# Patient Record
Sex: Male | Born: 1965 | Race: White | Hispanic: No | Marital: Single | Smoking: Current some day smoker
Health system: Southern US, Community
[De-identification: ages and names within clinical notes are randomized; demographics above are authoritative.]

## PROBLEM LIST (undated history)

## (undated) DIAGNOSIS — F419 Anxiety disorder, unspecified: Secondary | ICD-10-CM

## (undated) DIAGNOSIS — I10 Essential (primary) hypertension: Secondary | ICD-10-CM

## (undated) HISTORY — PX: ANKLE SURGERY: SHX546

## (undated) HISTORY — PX: NECK SURGERY: SHX720

## (undated) HISTORY — DX: Anxiety disorder, unspecified: F41.9

## (undated) HISTORY — PX: BACK SURGERY: SHX140

---

## 2016-11-15 ENCOUNTER — Emergency Department
Admission: EM | Admit: 2016-11-15 | Discharge: 2016-11-15 | Disposition: A | Discharge status: Home / Self Care | Payer: Self-pay | Source: Home / Self Care | Attending: Family Medicine | Admitting: Family Medicine

## 2016-11-15 ENCOUNTER — Emergency Department (INDEPENDENT_AMBULATORY_CARE_PROVIDER_SITE_OTHER): Payer: Self-pay

## 2016-11-15 ENCOUNTER — Encounter: Payer: Self-pay | Admitting: *Deleted

## 2016-11-15 DIAGNOSIS — H6502 Acute serous otitis media, left ear: Secondary | ICD-10-CM

## 2016-11-15 DIAGNOSIS — R0981 Nasal congestion: Secondary | ICD-10-CM

## 2016-11-15 DIAGNOSIS — J0100 Acute maxillary sinusitis, unspecified: Secondary | ICD-10-CM

## 2016-11-15 DIAGNOSIS — H9203 Otalgia, bilateral: Secondary | ICD-10-CM

## 2016-11-15 HISTORY — DX: Essential (primary) hypertension: I10

## 2016-11-15 MED ORDER — AMOXICILLIN 875 MG PO TABS: 875.0000 mg | ORAL_TABLET | Freq: Two times a day (BID) | ORAL | 0 refills | Status: DC

## 2016-11-15 MED ORDER — PREDNISONE 20 MG PO TABS: ORAL_TABLET | ORAL | 0 refills | Status: DC

## 2016-11-15 NOTE — ED Provider Notes (Signed)
[facility]    CSN: [id number] Arrival date & time: 11/15/16  1543     History   Chief Complaint Chief Complaint  Patient presents with  . Nasal Congestion    HPI Julian Myers is a 50 y.o. male.   Patient reports that he developed increased sinus congestion about a month ago, with intermittent sore throat and post-nasal drainage.  About 3 weeks ago his ears began to feel clogged and he developed facial pressure and pain.  During the past two weeks he has had increased cough with occasional shortness of breath and wheezing.  His symptoms have not responded to Advil Sinus and Alka Seltzer.   The history is provided by the patient.    Past Medical History:  Diagnosis Date  . Hypertension     There are no active problems to display for this patient.   Past Surgical History:  Procedure Laterality Date  . ANKLE SURGERY    . BACK SURGERY    . NECK SURGERY         Home Medications    Prior to Admission medications   Medication Sig Start Date End Date Taking? Authorizing Provider  Pseudoephedrine-Ibuprofen (ADVIL COLD/SINUS PO) Take by mouth.   Yes Historical Provider, MD  amoxicillin (AMOXIL) 875 MG tablet Take 1 tablet (875 mg total) by mouth 2 (two) times daily. 11/15/16   Lattie Haw, MD  predniSONE (DELTASONE) 20 MG tablet Take one tab by mouth twice daily for 5 days, then one daily. Take with food. 11/15/16   Lattie Haw, MD    Family History Family History  Problem Relation Age of Onset  . Diabetes Mother   . Hypertension Mother     Social History Social History  Substance Use Topics  . Smoking status: Never Smoker  . Smokeless tobacco: Never Used  . Alcohol use Yes     Comment: 5 q wk     Allergies   Patient has no known allergies.   Review of Systems Review of Systems + sore throat + cough No pleuritic pain + wheezing + nasal congestion + post-nasal drainage + sinus pain/pressure No itchy/red eyes ? earache No  hemoptysis + SOB No fever/chills No nausea No vomiting No abdominal pain No diarrhea No urinary symptoms No skin rash + fatigue No myalgias No headache Used OTC meds without relief   Physical Exam Triage Vital Signs ED Triage Vitals  Enc Vitals Group     BP 11/15/16 1608 (!) 156/119     Pulse Rate 11/15/16 1608 94     Resp 11/15/16 1608 16     Temp 11/15/16 1608 97.8 F (36.6 C)     Temp Source 11/15/16 1608 Oral     SpO2 11/15/16 1608 96 %     Weight 11/15/16 1608 222 lb (100.7 kg)     Height --      Head Circumference --      Peak Flow --      Pain Score 11/15/16 1609 0     Pain Loc --      Pain Edu? --      Excl. in GC? --    No data found.   Updated Vital Signs BP (!) 156/119 (BP Location: Left Arm)   Pulse 94   Temp 97.8 F (36.6 C) (Oral)   Resp 16   Wt 222 lb (100.7 kg)   SpO2 96%   Visual Acuity Right Eye Distance:   Left Eye Distance:  Bilateral Distance:    Right Eye Near:   Left Eye Near:    Bilateral Near:     Physical Exam Nursing notes and Vital Signs reviewed. Appearance:  Patient appears stated age, and in no acute distress Eyes:  Pupils are equal, round, and reactive to light and accomodation.  Extraocular movement is intact.  Conjunctivae are not inflamed  Ears:  Canals normal.  Right tympanic membrane normal; left tympanic membrane has serous effusion. Nose:  Mildly congested turbinates.  Maxillary sinus tenderness is present.  Pharynx:   Uvula erythematous. Neck:  Supple.  Tender enlarged posterior/lateral nodes are palpated bilaterally  Lungs:  Clear to auscultation.  Breath sounds are equal.  Moving air well. Heart:  Regular rate and rhythm without murmurs, rubs, or gallops.  Abdomen:  Nontender without masses or hepatosplenomegaly.  Bowel sounds are present.  No CVA or flank tenderness.  Extremities:  No edema.  Skin:  No rash present.    UC Treatments / Results  Labs (all labs ordered are listed, but only abnormal results  are displayed)  Labs Reviewed -   Tympanometry:  Right ear tympanogram positive peak pressure; Left ear tympanogram low peak height   EKG  EKG Interpretation None       Radiology Study Result   CLINICAL DATA:  Bilateral ear pain, nasal congestion and sinus pressure for 1 month.  EXAM: PARANASAL SINUSES - COMPLETE 3 + VIEW  COMPARISON:  None.  FINDINGS: The paranasal sinus are aerated. There is no evidence of sinus opacification air-fluid levels or mucosal thickening. No significant bone abnormalities are seen.  IMPRESSION: Negative exam.   Electronically Signed   By: Drusilla Kannerhomas  Dalessio M.D.   On: 11/15/2016 16:39     Procedures Procedures (including critical care time)  Medications Ordered in UC Medications - No data to display   Initial Impression / Assessment and Plan / UC Course  I have reviewed the triage vital signs and the nursing notes.  Pertinent labs & imaging results that were available during my care of the patient were reviewed by me and considered in my medical decision making (see chart for details).  Clinical Course   Patient's congestion may be primarily allergy related since sinus films negative. Suspect concomitant viral URI contributing cough and increased sinus congestion. Will begin prednisone burst/taper, and amoxicillin. May add Pseudoephedrine (30mg , one every 4 to 6 hours) for sinus congestion.  Get adequate rest.   May use Afrin nasal spray (or generic oxymetazoline) twice daily for about 5 days and then discontinue.  Also recommend using saline nasal spray several times daily and saline nasal irrigation (AYR is a common brand).  Use Flonase nasal spray each morning after using Afrin nasal spray and saline nasal irrigation. Stop all antihistamines for now, and other non-prescription cough/cold preparations. After prednisone finished, try an antihistamine such as Zyrtec daily.  Advised to monitor blood pressure and have blood  pressure evaluated if it remains elevated.     Final Clinical Impressions(s) / UC Diagnoses   Final diagnoses:  Acute maxillary sinusitis, recurrence not specified  Acute serous otitis media of left ear, recurrence not specified    New Prescriptions Discharge Medication List as of 11/15/2016  4:47 PM    START taking these medications   Details  amoxicillin (AMOXIL) 875 MG tablet Take 1 tablet (875 mg total) by mouth 2 (two) times daily., Starting Thu 11/15/2016, Print    predniSONE (DELTASONE) 20 MG tablet Take one tab by mouth twice daily  for 5 days, then one daily. Take with food., Print         Lattie HawStephen A Beese, MD 11/23/16 [phone]

## 2016-11-15 NOTE — Discharge Instructions (Signed)
May add Pseudoephedrine (30mg , one every 4 to 6 hours) for sinus congestion.  Get adequate rest.   May use Afrin nasal spray (or generic oxymetazoline) twice daily for about 5 days and then discontinue.  Also recommend using saline nasal spray several times daily and saline nasal irrigation (AYR is a common brand).  Use Flonase nasal spray each morning after using Afrin nasal spray and saline nasal irrigation. Stop all antihistamines for now, and other non-prescription cough/cold preparations.   Have blood pressure evaluated if it remains elevated.

## 2016-11-15 NOTE — ED Triage Notes (Signed)
Pt c/o nasal congestion, sinus pressure, bilateral ear pain and cough x 1 mth. Denies fever. He has taken Mucinex and Advil cold and sinus without relief.

## 2016-11-24 ENCOUNTER — Encounter: Payer: Self-pay | Admitting: Emergency Medicine

## 2016-11-24 ENCOUNTER — Emergency Department
Admission: EM | Admit: 2016-11-24 | Discharge: 2016-11-24 | Disposition: A | Discharge status: Home / Self Care | Payer: Self-pay | Source: Home / Self Care | Attending: Family Medicine | Admitting: Family Medicine

## 2016-11-24 DIAGNOSIS — H6983 Other specified disorders of Eustachian tube, bilateral: Secondary | ICD-10-CM

## 2016-11-24 MED ORDER — HYDROCODONE-ACETAMINOPHEN 5-325 MG PO TABS: 1.0000 | ORAL_TABLET | Freq: Four times a day (QID) | ORAL | 0 refills | Status: DC | PRN

## 2016-11-24 MED ORDER — METHYLPREDNISOLONE ACETATE 80 MG/ML IJ SUSP
80.0000 mg | Freq: Once | INTRAMUSCULAR | Status: AC
  Administered 2016-11-24: 80 mg via INTRAMUSCULAR

## 2016-11-24 MED ORDER — CEFDINIR 300 MG PO CAPS: 300.0000 mg | ORAL_CAPSULE | Freq: Two times a day (BID) | ORAL | 0 refills | Status: DC

## 2016-11-24 NOTE — ED Triage Notes (Signed)
Pt c/o bilateral ear pain has worsen since completing the prednisone and augmentin, worse @ night.  Seen here on 11/15/2016.

## 2016-11-24 NOTE — ED Provider Notes (Signed)
[facility]    CSN: [id number] Arrival date & time: 11/24/16  1019     History   Chief Complaint Chief Complaint  Patient presents with  . Otalgia    HPI Julian Myers is a 50 y.o. male.   Patient was treated here 11/18/16 for sinusitis and serous otitis.  He has finished amoxicillin and prednisone, but still has nasal congestion and bilateral ear pain worse at night.  No cough, fever, or other symptoms.   The history is provided by the patient.    Past Medical History:  Diagnosis Date  . Hypertension     There are no active problems to display for this patient.   Past Surgical History:  Procedure Laterality Date  . ANKLE SURGERY    . BACK SURGERY    . NECK SURGERY         Home Medications    Prior to Admission medications   Medication Sig Start Date End Date Taking? Authorizing Provider  cefdinir (OMNICEF) 300 MG capsule Take 1 capsule (300 mg total) by mouth 2 (two) times daily. 11/24/16   Lattie HawStephen A Kelbi Renstrom, MD    Family History Family History  Problem Relation Age of Onset  . Diabetes Mother   . Hypertension Mother     Social History Social History  Substance Use Topics  . Smoking status: Never Smoker  . Smokeless tobacco: Never Used  . Alcohol use Yes     Comment: 5 q wk     Allergies   Patient has no known allergies.   Review of Systems Review of Systems No sore throat No cough No pleuritic pain No wheezing + nasal congestion No post-nasal drainage No sinus pain/pressure No itchy/red eyes + earache No hemoptysis No SOB No fever/chills No nausea No vomiting No abdominal pain No diarrhea No urinary symptoms No skin rash No fatigue No myalgias No headache Used OTC meds without relief   Physical Exam Triage Vital Signs ED Triage Vitals  Enc Vitals Group     BP 11/24/16 1047 141/79     Pulse Rate 11/24/16 1047 98     Resp --      Temp 11/24/16 1047 98.2 F (36.8 C)     Temp Source 11/24/16 1047 Oral   SpO2 11/24/16 1047 95 %     Weight 11/24/16 1048 224 lb (101.6 kg)     Height 11/24/16 1048 6\' 1"  (1.854 m)     Head Circumference --      Peak Flow --      Pain Score 11/24/16 1049 7     Pain Loc --      Pain Edu? --      Excl. in GC? --    No data found.   Updated Vital Signs BP 141/79 (BP Location: Left Arm)   Pulse 98   Temp 98.2 F (36.8 C) (Oral)   Ht 6\' 1"  (1.854 m)   Wt 224 lb (101.6 kg)   SpO2 95%   BMI 29.55 kg/m   Visual Acuity Right Eye Distance:   Left Eye Distance:   Bilateral Distance:    Right Eye Near:   Left Eye Near:    Bilateral Near:     Physical Exam Nursing notes and Vital Signs reviewed. Appearance:  Patient appears stated age, and in no acute distress Eyes:  Pupils are equal, round, and reactive to light and accomodation.  Extraocular movement is intact.  Conjunctivae are not inflamed  Ears:  Canals normal.  Tympanic membranes normal.  Nose:  Mildly congested turbinates.  No sinus tenderness.   Pharynx:  Normal Neck:  Supple.  No adenopathy. Lungs:  Clear to auscultation.  Breath sounds are equal.  Moving air well. Heart:  Regular rate and rhythm without murmurs, rubs, or gallops.  Abdomen:  Nontender  Extremities:  No edema.  Skin:  No rash present.    UC Treatments / Results  Labs (all labs ordered are listed, but only abnormal results are displayed)  Labs Reviewed -   Tympanometry:  Right ear tympanogram low peak height; Left ear tympanogram low peak height ("flat line")  Review of previous tympanogram done 11/15/16 showed left tympanic membrane movement but decreased ("low peak height"), while right ear showed normal tympanic membrane movement but positive peak pressure.  Today's tympanogram shows complete lack of movement of left tympanic membrane ("flat line"), and significant decreased movement of right tympanic membrane. EKG  EKG Interpretation None       Radiology No results found.  Procedures Procedures (including  critical care time)  Medications Ordered in UC Medications  methylPREDNISolone acetate (DEPO-MEDROL) injection 80 mg (80 mg Intramuscular Given 11/24/16 1134)     Initial Impression / Assessment and Plan / UC Course  I have reviewed the triage vital signs and the nursing notes.  Pertinent labs & imaging results that were available during my care of the patient were reviewed by me and considered in my medical decision making (see chart for details).  Clinical Course   Administered Depo Medrol 80mg  IM  Begin Omnicef 300mg  BID Rx Lortab Q6hr prn pain Take Pseudoephedrine (30mg , one or two every 4 to 6 hours) for sinus congestion.  May use Afrin nasal spray (or generic oxymetazoline) twice daily for about 5 days and then discontinue.  Also recommend using saline nasal spray several times daily and saline nasal irrigation (AYR is a common brand).  Use Flonase nasal spray each morning after using Afrin nasal spray and saline nasal irrigation. Followup with ENT as soon as possible for further evaluation (advised to take copies of tympanograms to ENT).     Final Clinical Impressions(s) / UC Diagnoses   Final diagnoses:  Eustachian tube dysfunction, bilateral    New Prescriptions New Prescriptions   CEFDINIR (OMNICEF) 300 MG CAPSULE    Take 1 capsule (300 mg total) by mouth 2 (two) times daily.     Lattie HawStephen A Faven Watterson, MD 12/10/16 1045

## 2016-11-24 NOTE — Discharge Instructions (Signed)
Take Pseudoephedrine (30mg , one or two every 4 to 6 hours) for sinus congestion.  May use Afrin nasal spray (or generic oxymetazoline) twice daily for about 5 days and then discontinue.  Also recommend using saline nasal spray several times daily and saline nasal irrigation (AYR is a common brand).  Use Flonase nasal spray each morning after using Afrin nasal spray and saline nasal irrigation.

## 2016-11-29 ENCOUNTER — Telehealth: Payer: Self-pay | Admitting: *Deleted

## 2016-11-29 NOTE — Telephone Encounter (Signed)
Encounter created to enter tympanogram Order and result not entered on DOS.   

## 2018-08-06 IMAGING — DX DG SINUSES COMPLETE 3+V
3 series · 3 of 3 positions shown · non-contrast
Comparison: None.

CLINICAL DATA: Bilateral ear pain, nasal congestion and sinus
pressure for 1 month.

EXAM:
PARANASAL SINUSES - COMPLETE 3 + VIEW

[pns waters]
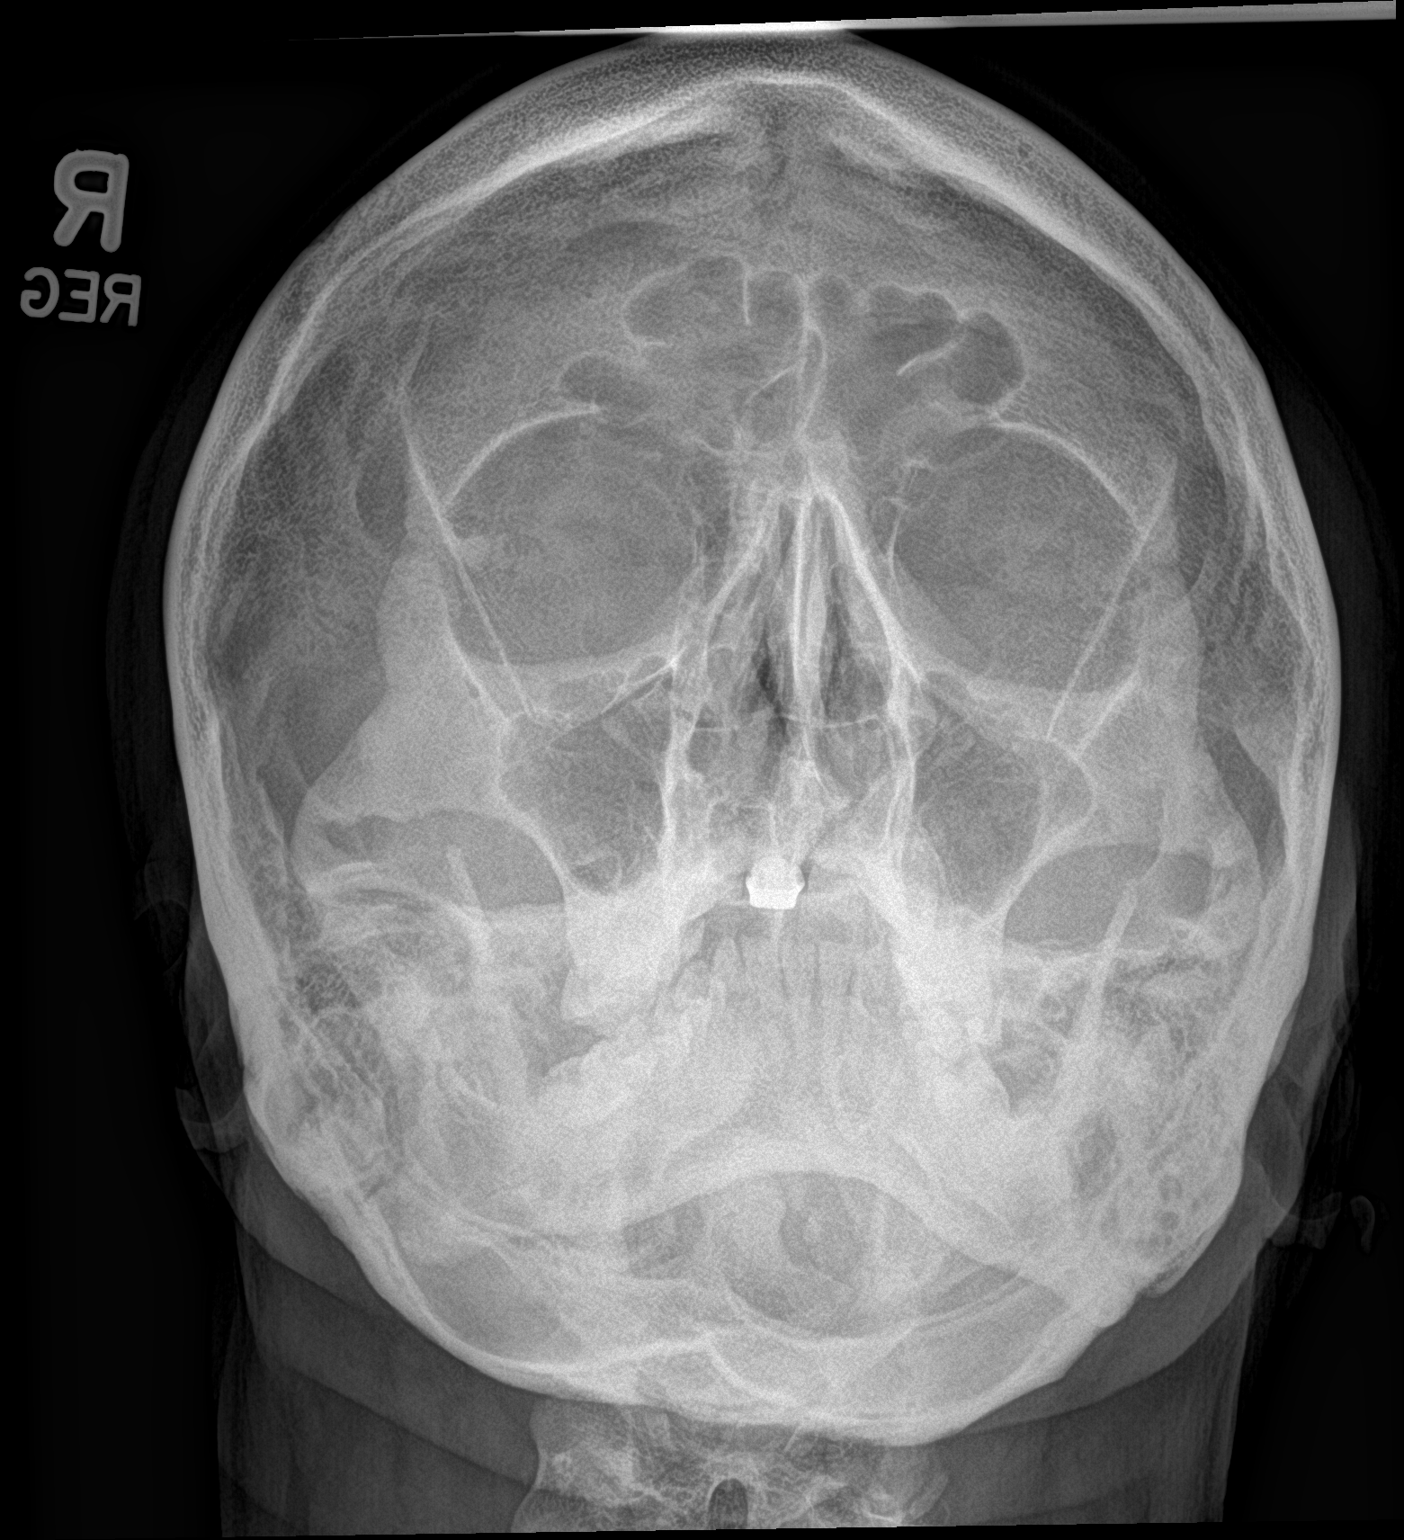

[[person_name]]
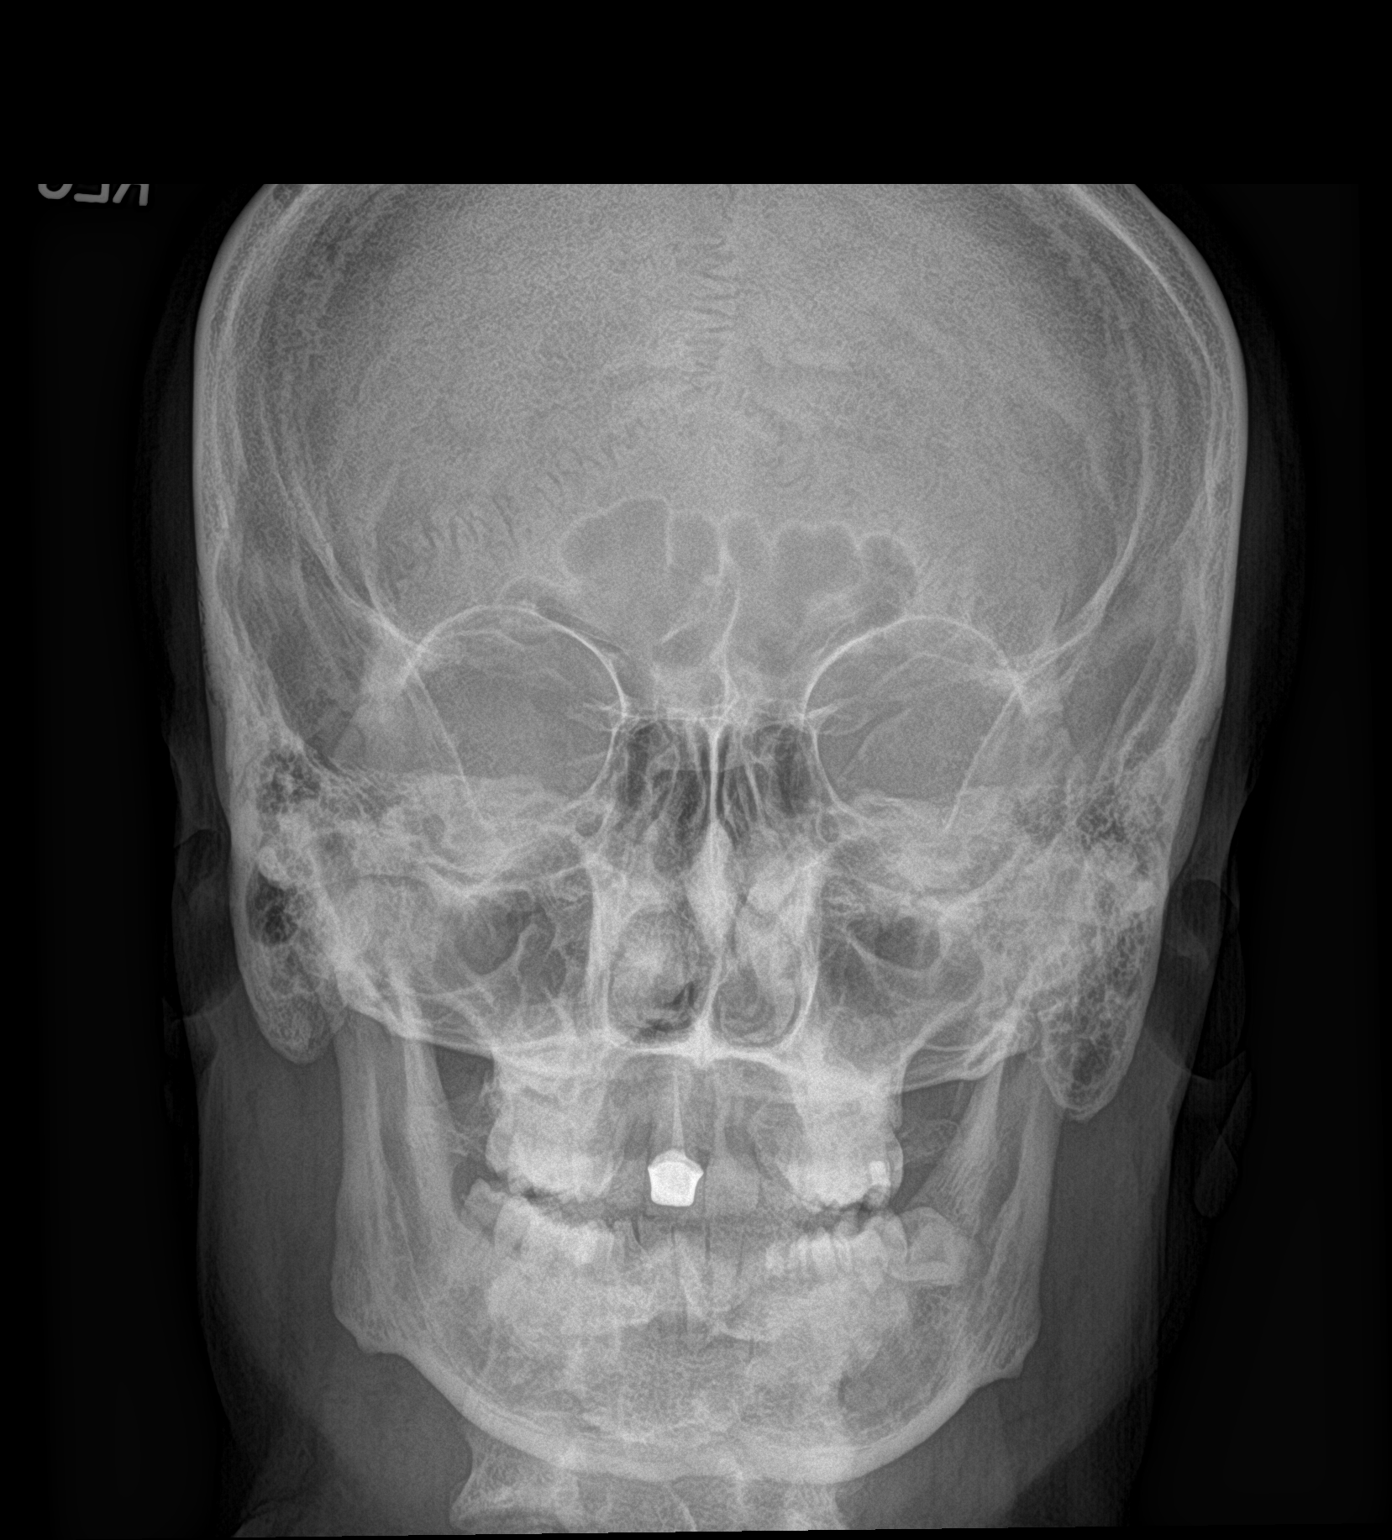

[pns lat]
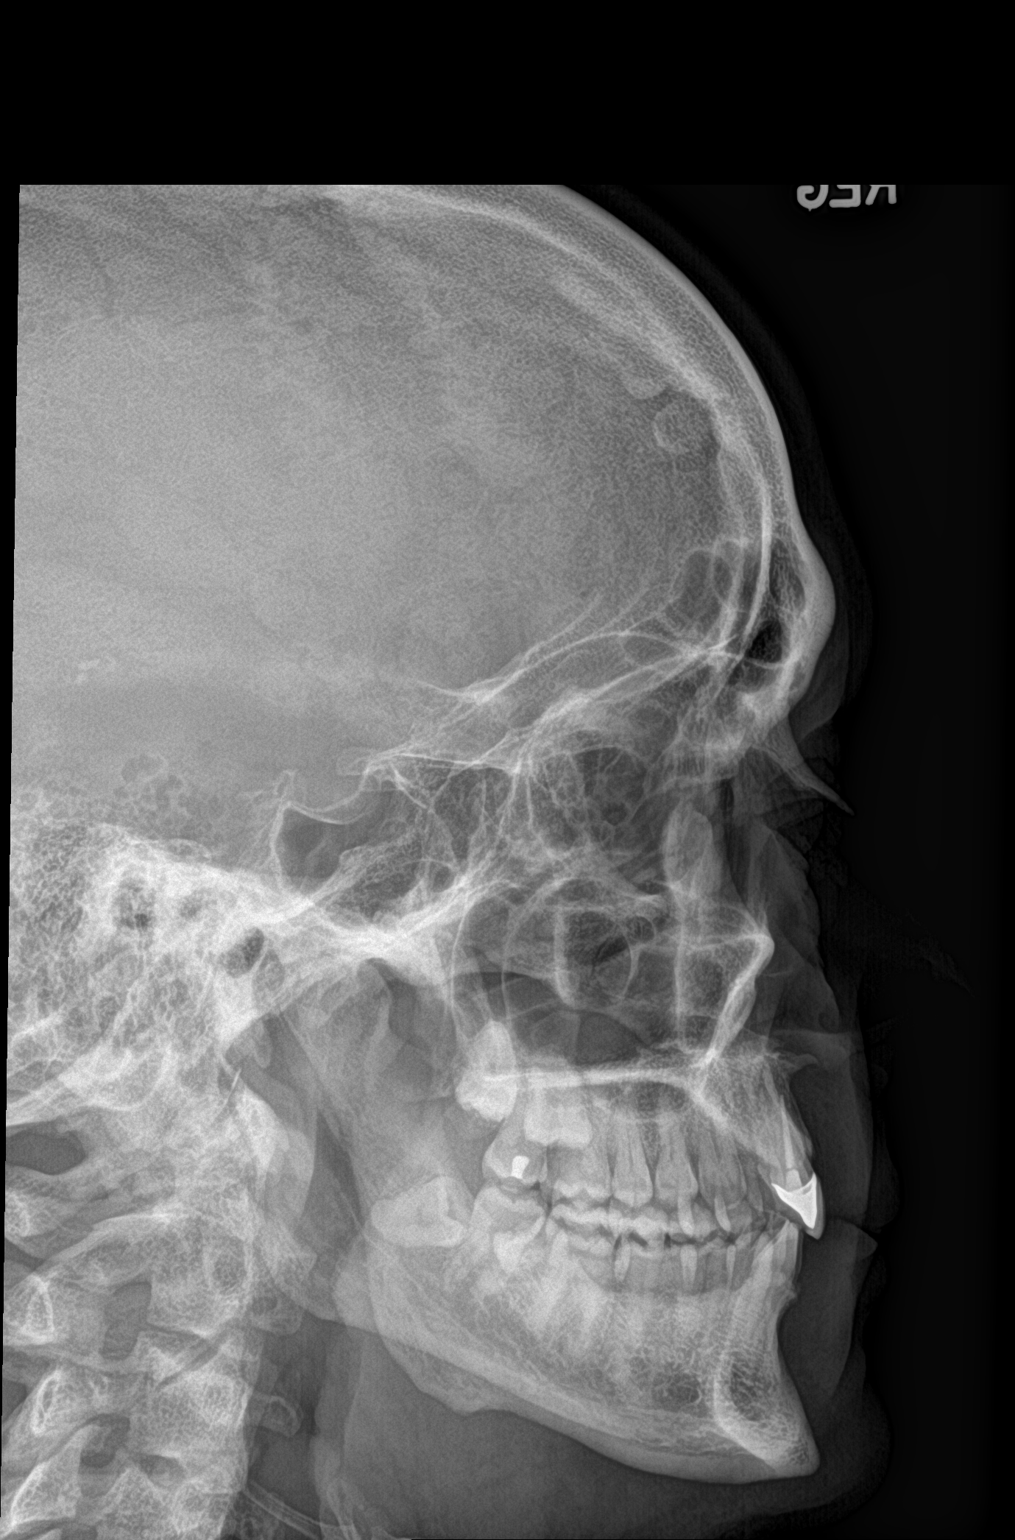

[3 of 3 positions shown; findings below may reference images not displayed]

FINDINGS: The paranasal sinus are aerated. There is no evidence of sinus
opacification air-fluid levels or mucosal thickening. No significant
bone abnormalities are seen.
IMPRESSION: Negative exam.

## 2018-08-15 ENCOUNTER — Other Ambulatory Visit: Payer: Self-pay

## 2018-08-15 ENCOUNTER — Emergency Department
Admission: EM | Admit: 2018-08-15 | Discharge: 2018-08-15 | Disposition: A | Discharge status: Home / Self Care | Payer: Self-pay | Source: Home / Self Care

## 2018-08-15 ENCOUNTER — Encounter: Payer: Self-pay | Admitting: Emergency Medicine

## 2018-08-15 DIAGNOSIS — J0100 Acute maxillary sinusitis, unspecified: Secondary | ICD-10-CM

## 2018-08-15 MED ORDER — AMOXICILLIN-POT CLAVULANATE 875-125 MG PO TABS: 1.0000 | ORAL_TABLET | Freq: Two times a day (BID) | ORAL | 0 refills | Status: DC

## 2018-08-15 NOTE — Discharge Instructions (Signed)
Return if any problems.

## 2018-08-15 NOTE — ED Provider Notes (Signed)
[facility]    CSN: [id number] Arrival date & time: 08/15/18  0816     History   Chief Complaint Chief Complaint  Patient presents with  . Nasal Congestion  . Headache  . Otalgia    HPI Julian Myers is a 52 y.o. male.   The history is provided by the patient. No language interpreter was used.  Otalgia  Associated symptoms: cough   Associated symptoms: no headaches   Cough  Cough characteristics:  Non-productive Sputum characteristics:  Nondescript Severity:  Moderate Onset quality:  Gradual Duration:  3 weeks Timing:  Constant Progression:  Worsening Chronicity:  New Smoker: no   Context: upper respiratory infection   Relieved by:  Nothing Worsened by:  Nothing Ineffective treatments:  None tried Associated symptoms: no headaches and no shortness of breath     Past Medical History:  Diagnosis Date  . Hypertension     There are no active problems to display for this patient.   Past Surgical History:  Procedure Laterality Date  . ANKLE SURGERY    . BACK SURGERY    . NECK SURGERY         Home Medications    Prior to Admission medications   Medication Sig Start Date End Date Taking? Authorizing Provider  amoxicillin-clavulanate (AUGMENTIN) 875-125 MG tablet Take 1 tablet by mouth every 12 (twelve) hours. 08/15/18   Elson AreasSofia, Leslie K, PA-C    Family History Family History  Problem Relation Age of Onset  . Diabetes Mother   . Hypertension Mother     Social History Social History   Tobacco Use  . Smoking status: Never Smoker  . Smokeless tobacco: Never Used  Substance Use Topics  . Alcohol use: Yes    Comment: 5 q wk  . Drug use: No     Allergies   Patient has no known allergies.   Review of Systems Review of Systems  Respiratory: Positive for cough. Negative for shortness of breath.   Neurological: Negative for headaches.  All other systems reviewed and are negative.    Physical Exam Triage Vital Signs ED Triage  Vitals [08/15/18 0833]  Enc Vitals Group     BP (!) 144/91     Pulse Rate 82     Resp 16     Temp 97.7 F (36.5 C)     Temp Source Oral     SpO2 97 %     Weight 215 lb (97.5 kg)     Height 6\' 1"  (1.854 m)     Head Circumference      Peak Flow      Pain Score 2     Pain Loc      Pain Edu?      Excl. in GC?    No data found.  Updated Vital Signs BP (!) 144/91 (BP Location: Right Arm)   Pulse 82   Temp 97.7 F (36.5 C) (Oral)   Resp 16   Ht 6\' 1"  (1.854 m)   Wt 215 lb (97.5 kg)   SpO2 97%   BMI 28.37 kg/m   Visual Acuity Right Eye Distance:   Left Eye Distance:   Bilateral Distance:    Right Eye Near:   Left Eye Near:    Bilateral Near:     Physical Exam  Constitutional: He appears well-developed and well-nourished.  HENT:  Head: Normocephalic and atraumatic.  Mouth/Throat: Oropharynx is clear and moist.  Tender maxillary and ethmoid sinuses  Eyes: Pupils are  equal, round, and reactive to light. EOM are normal.  Neck: Normal range of motion.  Cardiovascular: Normal rate.  Pulmonary/Chest: Effort normal.  Abdominal: Soft.  Musculoskeletal: Normal range of motion.  Neurological: He has normal strength.  Skin: Skin is warm.  Psychiatric: He has a normal mood and affect.  Nursing note reviewed.    UC Treatments / Results  Labs (all labs ordered are listed, but only abnormal results are displayed) Labs Reviewed - No data to display  EKG None  Radiology No results found.  Procedures Procedures (including critical care time)  Medications Ordered in UC Medications - No data to display  Initial Impression / Assessment and Plan / UC Course  I have reviewed the triage vital signs and the nursing notes.  Pertinent labs & imaging results that were available during my care of the patient were reviewed by me and considered in my medical decision making (see chart for details).      Final Clinical Impressions(s) / UC Diagnoses   Final diagnoses:    Acute non-recurrent maxillary sinusitis     Discharge Instructions     Return if any problems.    ED Prescriptions    Medication Sig Dispense Auth. Provider   amoxicillin-clavulanate (AUGMENTIN) 875-125 MG tablet Take 1 tablet by mouth every 12 (twelve) hours. 20 tablet Elson Areas, [place]     Controlled Substance Prescriptions [place] Controlled Substance Registry consulted? Not Applicable  An After Visit Summary was printed and given to the patient.    Elson Areas, [place] 08/15/18 [phone]

## 2018-08-15 NOTE — ED Triage Notes (Signed)
Patient reports headache, head and chest congestion, ear pain, sore throat, nausea for about 3 weeks; taking OTCs that have not helped.

## 2018-08-27 ENCOUNTER — Telehealth: Payer: Self-pay

## 2018-08-27 MED ORDER — AZITHROMYCIN 250 MG PO TABS: 250.0000 mg | ORAL_TABLET | Freq: Every day | ORAL | 0 refills | Status: DC

## 2018-08-27 NOTE — Telephone Encounter (Signed)
Depending on the duration he has taken the Augmentin, he can be changed to Azithromycin if he still has 2 or more days left and still symptomatic.  Encourage pt to take over the counter probiotics and/or eating yogurt with probiotics and taking medication with food to help GI upset.

## 2018-08-27 NOTE — Telephone Encounter (Signed)
Pt would like the medication to be sent to [facility] on [address] in [place].  I explained the probiotics, and yogurt also.

## 2018-11-11 ENCOUNTER — Emergency Department
Admission: EM | Admit: 2018-11-11 | Discharge: 2018-11-11 | Disposition: A | Discharge status: Home / Self Care | Payer: Self-pay | Source: Home / Self Care

## 2018-11-11 ENCOUNTER — Other Ambulatory Visit: Payer: Self-pay

## 2018-11-11 ENCOUNTER — Encounter: Payer: Self-pay | Admitting: Emergency Medicine

## 2018-11-11 DIAGNOSIS — I10 Essential (primary) hypertension: Secondary | ICD-10-CM

## 2018-11-11 MED ORDER — LISINOPRIL 10 MG PO TABS: 10.0000 mg | ORAL_TABLET | Freq: Every day | ORAL | 0 refills | Status: DC

## 2018-11-11 NOTE — ED Provider Notes (Signed)
[facility]    CSN: [id number] Arrival date & time: 11/11/18  1101     History   Chief Complaint Chief Complaint  Patient presents with  . Hypertension    HPI Julian MaxinLeon Siwek is a 52 y.o. male.   HPI Julian Myers is a 52 y.o. male presenting to UC with c/o intermittent HA and dizziness related to uncontrolled HTN. He was seen in the emergency department last week for a work related Left wrist injury, while there, he was advised his BP was dangerously high. He was advised to f/u immediately for his BP. He cannot see his PCP until the third week of January.  He use to be on HCTZ several years ago but took himself off the medication after a diet change and weight loss.      Past Medical History:  Diagnosis Date  . Hypertension     There are no active problems to display for this patient.   Past Surgical History:  Procedure Laterality Date  . ANKLE SURGERY    . BACK SURGERY    . NECK SURGERY         Home Medications    Prior to Admission medications   Medication Sig Start Date End Date Taking? Authorizing Provider  lisinopril (PRINIVIL,ZESTRIL) 10 MG tablet Take 1 tablet (10 mg total) by mouth daily. 11/11/18   Lurene ShadowPhelps, Kiaira Pointer O, PA-C    Family History Family History  Problem Relation Age of Onset  . Diabetes Mother   . Hypertension Mother   . Hypertension Father     Social History Social History   Tobacco Use  . Smoking status: Never Smoker  . Smokeless tobacco: Never Used  Substance Use Topics  . Alcohol use: Yes    Comment: 5 q wk  . Drug use: No     Allergies   Patient has no known allergies.   Review of Systems Review of Systems  Cardiovascular: Negative for chest pain and palpitations.  Gastrointestinal: Negative for diarrhea, nausea and vomiting.  Neurological: Positive for headaches. Negative for dizziness and light-headedness.     Physical Exam Triage Vital Signs ED Triage Vitals  Enc Vitals Group     BP 11/11/18 1119 (!)  137/100     Pulse Rate 11/11/18 1119 84     Resp --      Temp 11/11/18 1119 98 F (36.7 C)     Temp Source 11/11/18 1119 Oral     SpO2 11/11/18 1119 97 %     Weight 11/11/18 1120 220 lb (99.8 kg)     Height 11/11/18 1120 6\' 1"  (1.854 m)     Head Circumference --      Peak Flow --      Pain Score 11/11/18 1120 0     Pain Loc --      Pain Edu? --      Excl. in GC? --    No data found.  Updated Vital Signs BP (!) 137/100 (BP Location: Right Arm)   Pulse 84   Temp 98 F (36.7 C) (Oral)   Ht 6\' 1"  (1.854 m)   Wt 220 lb (99.8 kg)   SpO2 97%   BMI 29.03 kg/m   Visual Acuity Right Eye Distance:   Left Eye Distance:   Bilateral Distance:    Right Eye Near:   Left Eye Near:    Bilateral Near:     Physical Exam  Constitutional: He is oriented to person, place, and time. He  appears well-developed and well-nourished.  HENT:  Head: Normocephalic and atraumatic.  Right Ear: Tympanic membrane normal.  Left Ear: Tympanic membrane normal.  Nose: Nose normal.  Mouth/Throat: Uvula is midline, oropharynx is clear and moist and mucous membranes are normal.  Eyes: Pupils are equal, round, and reactive to light. Conjunctivae and EOM are normal.  Neck: Normal range of motion. Neck supple.  Cardiovascular: Normal rate and regular rhythm.  Pulmonary/Chest: Effort normal and breath sounds normal. No stridor. No respiratory distress. He has no wheezes. He has no rales.  Musculoskeletal: Normal range of motion.  Neurological: He is alert and oriented to person, place, and time. No cranial nerve deficit.  Skin: Skin is warm and dry.  Psychiatric: He has a normal mood and affect. His behavior is normal.  Nursing note and vitals reviewed.    UC Treatments / Results  Labs (all labs ordered are listed, but only abnormal results are displayed) Labs Reviewed - No data to display  EKG None  Radiology No results found.  Procedures Procedures (including critical care  time)  Medications Ordered in UC Medications - No data to display  Initial Impression / Assessment and Plan / UC Course  I have reviewed the triage vital signs and the nursing notes.  Pertinent labs & imaging results that were available during my care of the patient were reviewed by me and considered in my medical decision making (see chart for details).     Uncontrolled HTN pt refused labs to check renal function and protassium level, will hold off on prescribing more HCTZ at this time. Will start pt on Lisinopril 10mg    Final Clinical Impressions(s) / UC Diagnoses   Final diagnoses:  Uncontrolled hypertension     Discharge Instructions      It is very important to keep the appointment that you have scheduled for the end of January. In the meantime, be sure to take your medication as prescribed and keep track of your blood pressure to look for any trends when you go in January.  If you develop chest pain, severe headache, passing out, or other new concerning symptoms develop, please call 911 or have someone drive you to the emergency department.     ED Prescriptions    Medication Sig Dispense Auth. Provider   lisinopril (PRINIVIL,ZESTRIL) 10 MG tablet Take 1 tablet (10 mg total) by mouth daily. 30 tablet Lurene Shadow, PA-C     Controlled Substance Prescriptions [place] Controlled Substance Registry consulted? Not Applicable   Lurene Shadow, PA-C 11/11/18 1301

## 2018-11-11 NOTE — ED Triage Notes (Signed)
Hypertension, was seen in ED for a work injury, BP was elevated advised to go to Urgent Care and get meds immediately. He was given HCTZ for 5 days, has appt with PCP in January.  Has lightheadedness intermittently.

## 2018-11-11 NOTE — Discharge Instructions (Addendum)
°  It is very important to keep the appointment that you have scheduled for the end of January. In the meantime, be sure to take your medication as prescribed and keep track of your blood pressure to look for any trends when you go in January.  If you develop chest pain, severe headache, passing out, or other new concerning symptoms develop, please call 911 or have someone drive you to the emergency department.

## 2019-02-16 ENCOUNTER — Other Ambulatory Visit: Payer: Self-pay

## 2019-02-16 ENCOUNTER — Emergency Department (INDEPENDENT_AMBULATORY_CARE_PROVIDER_SITE_OTHER)
Admission: EM | Admit: 2019-02-16 | Discharge: 2019-02-16 | Disposition: A | Discharge status: Home / Self Care | Payer: Self-pay | Source: Home / Self Care

## 2019-02-16 DIAGNOSIS — R52 Pain, unspecified: Secondary | ICD-10-CM

## 2019-02-16 DIAGNOSIS — G4700 Insomnia, unspecified: Secondary | ICD-10-CM

## 2019-02-16 DIAGNOSIS — G44209 Tension-type headache, unspecified, not intractable: Secondary | ICD-10-CM

## 2019-02-16 LAB — POCT INFLUENZA A/B
Influenza A, POC: NEGATIVE
Influenza B, POC: NEGATIVE

## 2019-02-16 MED ORDER — ACETAMINOPHEN 325 MG PO TABS: 650.0000 mg | ORAL_TABLET | Freq: Four times a day (QID) | ORAL | 0 refills | Status: DC | PRN

## 2019-02-16 MED ORDER — NAPROXEN 500 MG PO TABS: 500.0000 mg | ORAL_TABLET | Freq: Two times a day (BID) | ORAL | 0 refills | Status: DC

## 2019-02-16 NOTE — ED Provider Notes (Addendum)
[facility]    CSN: [id number] Arrival date & time: 02/16/19  1435     History   Chief Complaint Chief Complaint  Patient presents with  . Headache  . Nausea  . Torticollis    HPI Julian Myers is a 53 y.o. male.   HPI Julian Myers is a 53 y.o. male presenting to UC with c/o fatigue that started 1 week ago with associated body aches, HA, insomnia and neck stiffness and nausea that started yesterday, worse today. Pt has been taking OTC Tylenol Cold & Flu medication 1-2 times daily all week along with OTC Unisom to help with sleep. He reports having "crazy dreams" but also reports fatigue due to not being able to sleep. Pt states he feels like he has the flu. He did not receive the flu vaccine.  Denies cough, congestion, sore throat. Denies fever, chills, vomiting or diarrhea. His girlfriend is here for migraine and nausea but no URI symptoms. No recent travel.  No new medications or supplements besides the OTC Cold & Flu and OTC sleep medication.   Past Medical History:  Diagnosis Date  . Hypertension     There are no active problems to display for this patient.   Past Surgical History:  Procedure Laterality Date  . ANKLE SURGERY    . BACK SURGERY    . NECK SURGERY         Home Medications    Prior to Admission medications   Medication Sig Start Date End Date Taking? Authorizing Provider  acetaminophen (TYLENOL) 325 MG tablet Take 2 tablets (650 mg total) by mouth every 6 (six) hours as needed for headache. 02/16/19   Lurene Shadow, PA-C  lisinopril (PRINIVIL,ZESTRIL) 10 MG tablet Take 1 tablet (10 mg total) by mouth daily. 11/11/18   Lurene Shadow, PA-C  naproxen (NAPROSYN) 500 MG tablet Take 1 tablet (500 mg total) by mouth 2 (two) times daily. 02/16/19   Lurene Shadow, PA-C    Family History Family History  Problem Relation Age of Onset  . Diabetes Mother   . Hypertension Mother   . Hypertension Father     Social History Social History    Tobacco Use  . Smoking status: Never Smoker  . Smokeless tobacco: Never Used  Substance Use Topics  . Alcohol use: Yes    Comment: 5 q wk  . Drug use: No     Allergies   Patient has no known allergies.   Review of Systems Review of Systems  Constitutional: Positive for fatigue. Negative for chills and fever.  HENT: Negative for congestion, ear pain, sore throat, trouble swallowing and voice change.   Respiratory: Negative for cough and shortness of breath.   Cardiovascular: Negative for chest pain and palpitations.  Gastrointestinal: Positive for nausea. Negative for abdominal pain, diarrhea and vomiting.  Musculoskeletal: Positive for neck pain and neck stiffness. Negative for arthralgias, back pain and myalgias.  Skin: Negative for rash.  Neurological: Positive for headaches. Negative for dizziness and light-headedness.     Physical Exam Triage Vital Signs ED Triage Vitals  Enc Vitals Group     BP 02/16/19 1502 120/87     Pulse Rate 02/16/19 1502 94     Resp 02/16/19 1502 18     Temp 02/16/19 1502 98 F (36.7 C)     Temp Source 02/16/19 1502 Oral     SpO2 02/16/19 1502 97 %     Weight 02/16/19 1503 219 lb (99.3 kg)  Height 02/16/19 1503 6\' 1"  (1.854 m)     Head Circumference --      Peak Flow --      Pain Score 02/16/19 1503 0     Pain Loc --      Pain Edu? --      Excl. in GC? --    No data found.  Updated Vital Signs BP 120/87 (BP Location: Right Arm)   Pulse 94   Temp 98 F (36.7 C) (Oral)   Resp 18   Ht 6\' 1"  (1.854 m)   Wt 219 lb (99.3 kg)   SpO2 97%   BMI 28.89 kg/m   Visual Acuity Right Eye Distance:   Left Eye Distance:   Bilateral Distance:    Right Eye Near:   Left Eye Near:    Bilateral Near:     Physical Exam Vitals signs and nursing note reviewed.  Constitutional:      Appearance: He is well-developed.  HENT:     Head: Normocephalic and atraumatic.     Right Ear: Tympanic membrane normal.     Left Ear: Tympanic membrane  normal.     Nose: Nose normal.     Right Sinus: No maxillary sinus tenderness or frontal sinus tenderness.     Left Sinus: No maxillary sinus tenderness or frontal sinus tenderness.     Mouth/Throat:     Lips: Pink.     Mouth: Mucous membranes are moist.     Pharynx: Oropharynx is clear. Uvula midline.  Eyes:     General: No visual field deficit.    Extraocular Movements: Extraocular movements intact.     Right eye: Normal extraocular motion.     Left eye: Normal extraocular motion.     Pupils: Pupils are equal, round, and reactive to light.  Neck:     Musculoskeletal: Normal range of motion and neck supple.     Comments: No nuchal rigidity or meningeal signs. Cardiovascular:     Rate and Rhythm: Normal rate and regular rhythm.  Pulmonary:     Effort: Pulmonary effort is normal.     Breath sounds: Normal breath sounds.  Musculoskeletal: Normal range of motion.  Skin:    General: Skin is warm and dry.  Neurological:     Mental Status: He is alert and oriented to person, place, and time.     GCS: GCS eye subscore is 4. GCS verbal subscore is 5. GCS motor subscore is 6.     Cranial Nerves: No cranial nerve deficit, dysarthria or facial asymmetry.     Sensory: No sensory deficit.     Coordination: Coordination normal.     Gait: Gait normal.  Psychiatric:        Mood and Affect: Mood normal.        Behavior: Behavior normal.      UC Treatments / Results  Labs (all labs ordered are listed, but only abnormal results are displayed) Labs Reviewed  POCT INFLUENZA A/B    EKG None  Radiology No results found.  Procedures Procedures (including critical care time)  Medications Ordered in UC Medications - No data to display  Initial Impression / Assessment and Plan / UC Course  I have reviewed the triage vital signs and the nursing notes.  Pertinent labs & imaging results that were available during my care of the patient were reviewed by me and considered in my medical  decision making (see chart for details).     Normal neuro exam No  evidence of CVA or meningitis Flu test: NEGATIVE, which was expected given lack of URI symptoms.  Suspect pt's OTC medications are contributing to poor sleep & vivid dreams, thus worsening HA and neck soreness.  Recommend discontinuing OTC cold & flu medication Plain acetaminophen and naproxen sent to pharmacy Offered pt work note. Pt denied any other questions concerns.  Pt has previously scheduled routine exam with PCP on Monday 02/23/2019 for his HTN, encouraged to keep appointment  Pt left AVS in exam room after discharge.   Final Clinical Impressions(s) / UC Diagnoses   Final diagnoses:  Body aches  Tension headache  Insomnia, unspecified type     Discharge Instructions      Please just take the pain medication prescribed today as needed for body aches and headaches.   It is recommended you get plenty of rest, stay well hydrated, do not take more pain medication than prescribed.    Your insomnia, nausea, hallucinations/vivid dreams could be due to the combination cough/cold medications you have been taking.  Please use caution when taking over the counter medications. If you have taken before you can always develop adverse side effects later on as everyone reacts to medications differently.  Please be sure to follow up with your family doctor on Monday as previously scheduled.  Call 911 or go to the hospital if symptoms significantly worsening.     ED Prescriptions    Medication Sig Dispense Auth. Provider   naproxen (NAPROSYN) 500 MG tablet Take 1 tablet (500 mg total) by mouth 2 (two) times daily. 14 tablet Waylan Rocher O, PA-C   acetaminophen (TYLENOL) 325 MG tablet Take 2 tablets (650 mg total) by mouth every 6 (six) hours as needed for headache. 30 tablet Lurene Shadow, PA-C     Controlled Substance Prescriptions [place] Controlled Substance Registry consulted? Not Applicable     Rolla Plate 02/16/19 1734

## 2019-02-16 NOTE — ED Triage Notes (Signed)
Pt stated that sx started about 1 week ago with fatigue.  Yesterday felt worse, and today worse than yesterday.  Pt is having trouble sleeping, pain in the back of the head, Stiff neck.  Body aches and nausea for the last 3 days.

## 2019-02-16 NOTE — Discharge Instructions (Signed)
°  Please just take the pain medication prescribed today as needed for body aches and headaches.   It is recommended you get plenty of rest, stay well hydrated, do not take more pain medication than prescribed.    Your insomnia, nausea, hallucinations/vivid dreams could be due to the combination cough/cold medications you have been taking.  Please use caution when taking over the counter medications. If you have taken before you can always develop adverse side effects later on as everyone reacts to medications differently.  Please be sure to follow up with your family doctor on Monday as previously scheduled.  Call 911 or go to the hospital if symptoms significantly worsening.

## 2019-07-14 ENCOUNTER — Encounter: Payer: Self-pay | Admitting: Osteopathic Medicine

## 2019-07-14 ENCOUNTER — Other Ambulatory Visit: Payer: Self-pay

## 2019-07-14 ENCOUNTER — Ambulatory Visit (INDEPENDENT_AMBULATORY_CARE_PROVIDER_SITE_OTHER): Payer: Self-pay | Admitting: Osteopathic Medicine

## 2019-07-14 VITALS — BP 143/103 | HR 96 | Temp 98.2°F | Ht 73.0 in | Wt 225.6 lb

## 2019-07-14 DIAGNOSIS — F411 Generalized anxiety disorder: Secondary | ICD-10-CM

## 2019-07-14 DIAGNOSIS — I10 Essential (primary) hypertension: Secondary | ICD-10-CM

## 2019-07-14 DIAGNOSIS — Z9109 Other allergy status, other than to drugs and biological substances: Secondary | ICD-10-CM

## 2019-07-14 DIAGNOSIS — Z8669 Personal history of other diseases of the nervous system and sense organs: Secondary | ICD-10-CM

## 2019-07-14 MED ORDER — LOSARTAN POTASSIUM 50 MG PO TABS: 25.0000 mg | ORAL_TABLET | Freq: Every day | ORAL | 1 refills | Status: DC

## 2019-07-14 MED ORDER — SUMATRIPTAN SUCCINATE 50 MG PO TABS: 50.0000 mg | ORAL_TABLET | Freq: Once | ORAL | 3 refills | Status: DC | PRN

## 2019-07-14 MED ORDER — LORAZEPAM 0.5 MG PO TABS: 0.2500 mg | ORAL_TABLET | Freq: Two times a day (BID) | ORAL | 0 refills | Status: DC | PRN

## 2019-07-14 NOTE — Progress Notes (Signed)
HPI: Julian Myers is a 53 y.o. male who  has a past medical history of Anxiety and Hypertension.  he [facility], 07/14/19,  for chief complaint of: New to establish care   HTN:  Taking amlodipine 5 mg daily but would like to maybe try switching this medication, he has felt GI bloating and lower extremity swelling since starting this medicine.  Reports last labs were maybe 6 months ago and were all normal  Anxiety: Reports taking Lorazepam 0.5 mg 2-3 per day as needed written on his intake form but he states he takes it really only on weekends as he is very nervous to take anything potentially sedating.  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 PDMP reviewed.  Patient is very against taking daily maintenance medications.  Reports increased anxiety due to financial issues, legal issues and ongoing custody battle.  Migraine: Reports every few weeks, last episode was a couple of weeks ago.  He thinks he was on some sort of preventive medication in the past but cannot recall name.  Environmental allergies: Pills antihistamine OTC make him tired so he does not want to take these, reports significant sinus pain and pressure especially this time of year      Past medical, surgical, social and family history reviewed:  Patient Active Problem List   Diagnosis Date Noted  . Environmental allergies 07/15/2019  . History of migraine 07/15/2019  . Anxiety state 07/15/2019  . Essential hypertension 07/15/2019    Past Surgical History:  Procedure Laterality Date  . ANKLE SURGERY    . BACK SURGERY    . NECK SURGERY      Social History   Tobacco Use  . Smoking status: Never Smoker  . Smokeless tobacco: Never Used  Substance Use Topics  . Alcohol use: Yes    Comment: 5 q wk    Family History  Problem Relation Age of Onset  . Diabetes Mother    . Hypertension Mother   . Hypertension Father      Current medication list and allergy/intolerance information reviewed:    Current Outpatient Medications  Medication Sig Dispense Refill  . LORazepam (ATIVAN) 0.5 MG tablet Take 0.5-1 tablets (0.25-0.5 mg total) by mouth 2 (two) times daily as needed for anxiety. #30 FOR 90 DAYS 30 tablet 0  . losartan (COZAAR) 50 MG tablet Take 0.5 tablets (25 mg total) by mouth daily. 90 tablet 1  . SUMAtriptan (IMITREX) 50 MG tablet Take 1 tablet (50 mg total) by mouth once as needed for up to 1 dose for migraine. May repeat in 2 hours if headache persists or recurs. 9 tablet 3   No current facility-administered medications for this visit.     No Known Allergies    Review of Systems:  Constitutional:  No  fever, no chills, No recent illness, No unintentional weight changes. No significant fatigue.   HEENT: +headache, no vision change, no hearing change, No sore throat, +sinus pressure  Cardiac: No  chest pain, No  pressure, No palpitations, No  Orthopnea  Respiratory:  No  shortness of breath. No  Cough  Gastrointestinal: No  abdominal pain, No  nausea, No  vomiting,  No  blood in stool, No  diarrhea, No  constipation   Musculoskeletal: No new myalgia/arthralgia  Skin: No  Rash, No other wounds/concerning lesions  Genitourinary: No  incontinence, No  abnormal genital bleeding, No abnormal genital discharge  Hem/Onc: No  easy bruising/bleeding, No  abnormal lymph node  Endocrine: No cold intolerance,  No heat intolerance. No polyuria/polydipsia/polyphagia   Neurologic: No  weakness, No  dizziness, No  slurred speech/focal weakness/facial droop  Psychiatric: No  concerns with depression, No  concerns with anxiety, No sleep problems, No mood problems  Exam:  BP (!) 143/103 (BP Location: Left Arm, Patient Position: Sitting, Cuff Size: Normal)   Pulse 96   Temp 98.2 F (36.8 C) (Oral)   Ht 6\' 1"  (1.854 m)   Wt 225 lb 9.6 oz (102.3 kg)    BMI 29.76 kg/m   Constitutional: VS see above. General Appearance: alert, well-developed, well-nourished, NAD  Eyes: Normal lids and conjunctive, non-icteric sclera  Ears, Nose, Mouth, Throat: TM normal bilaterally.   Neck: No masses, trachea midline. No thyroid enlargement. No tenderness/mass appreciated. No lymphadenopathy  Respiratory: Normal respiratory effort. no wheeze, no rhonchi, no rales  Cardiovascular: S1/S2 normal, no murmur, no rub/gallop auscultated. RRR. No lower extremity edema.   Gastrointestinal: Nontender, no masses. No hepatomegaly, no splenomegaly. No hernia appreciated. Bowel sounds normal. Rectal exam deferred.   Musculoskeletal: Gait normal. No clubbing/cyanosis of digits.   Neurological: Normal balance/coordination. No tremor. No cranial nerve deficit on limited exam. Motor and sensation intact and symmetric. Cerebellar reflexes intact.   Skin: warm, dry, intact. No rash/ulcer. No concerning nevi or subq nodules on limited exam.    Psychiatric: Normal judgment/insight. Normal mood and affect. Oriented x3.    No results found for this or any previous visit (from the past 72 hour(s)).  No results found.   ASSESSMENT/PLAN: The primary encounter diagnosis was Environmental allergies. Diagnoses of History of migraine, Anxiety state, and Essential hypertension were also pertinent to this visit.    Meds ordered this encounter  Medications  . DISCONTD: losartan (COZAAR) 50 MG tablet    Sig: Take 0.5 tablets (25 mg total) by mouth daily.    Dispense:  90 tablet    Refill:  1  . DISCONTD: LORazepam (ATIVAN) 0.5 MG tablet    Sig: Take 0.5-1 tablets (0.25-0.5 mg total) by mouth 2 (two) times daily as needed for anxiety. #30 FOR 90 DAYS    Dispense:  30 tablet    Refill:  0  . SUMAtriptan (IMITREX) 50 MG tablet    Sig: Take 1 tablet (50 mg total) by mouth once as needed for up to 1 dose for migraine. May repeat in 2 hours if headache persists or recurs.     Dispense:  9 tablet    Refill:  3  . LORazepam (ATIVAN) 0.5 MG tablet    Sig: Take 0.5-1 tablets (0.25-0.5 mg total) by mouth 2 (two) times daily as needed for anxiety. #30 FOR 90 DAYS    Dispense:  30 tablet    Refill:  0  . losartan (COZAAR) 50 MG tablet    Sig: Take 0.5 tablets (25 mg total) by mouth daily.    Dispense:  90 tablet    Refill:  1    Patient Instructions  Plan:  Allergies: Try Flonase/Nasonex over-the-counter nasal sprays as discussed at your visit.  Can try adding Allegra in the evenings, this is the least sedating of the pill medications/antihistamines.  Anxiety: Would strongly consider daily preventative medication for anxiety issues.  I have sent the prescription for the lorazepam but please be aware that this is not recommended for daily use.  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  30 pills will have to last 90 days!   Blood pressure: We are changing her medication from amlodipine to losartan, this may also help a bit with migraine prevention.  I sent 50 mg tablets, can start at half tablet daily/25 mg, plan to return to the office for a nurse visit to recheck blood pressure in 1 to 2 weeks to make sure this medication is keeping blood pressure at an acceptable goal  Migraines: I sent medication called Imitrex / sumatriptan to take as needed for headache.  May also consider over-the-counter supplementation with magnesium citrate 400 to 800 mg/day, butter bur, feverfew have also been shown to be effective.       Visit summary with medication list and pertinent instructions was printed for patient to review. All questions at time of visit were answered - patient instructed to contact office with any additional concerns or updates. ER/RTC precautions were reviewed with the patient.   Please note: voice recognition software was used to produce this document, and typos may  escape review. Please contact Dr. Sheppard Coil for any needed clarifications.     Follow-up plan: Return in about 1 week (around 07/21/2019) for Nurse visit recheck blood pressure.

## 2019-07-14 NOTE — Patient Instructions (Addendum)
Plan:  Allergies: Try Flonase/Nasonex over-the-counter nasal sprays as discussed at your visit.  Can try adding Allegra in the evenings, this is the least sedating of the pill medications/antihistamines.  Anxiety: Would strongly consider daily preventative medication for anxiety issues.  I have sent the prescription for the lorazepam but please be aware that this is not recommended for daily use.  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  30 pills will have to last 90 days!   Blood pressure: We are changing her medication from amlodipine to losartan, this may also help a bit with migraine prevention.  I sent 50 mg tablets, can start at half tablet daily/25 mg, plan to return to the office for a nurse visit to recheck blood pressure in 1 to 2 weeks to make sure this medication is keeping blood pressure at an acceptable goal  Migraines: I sent medication called Imitrex / sumatriptan to take as needed for headache.  May also consider over-the-counter supplementation with magnesium citrate 400 to 800 mg/day, butter bur, feverfew have also been shown to be effective.

## 2019-07-15 DIAGNOSIS — I10 Essential (primary) hypertension: Secondary | ICD-10-CM

## 2019-07-15 DIAGNOSIS — F411 Generalized anxiety disorder: Secondary | ICD-10-CM | POA: Insufficient documentation

## 2019-07-15 DIAGNOSIS — Z9109 Other allergy status, other than to drugs and biological substances: Secondary | ICD-10-CM

## 2019-07-15 DIAGNOSIS — Z8669 Personal history of other diseases of the nervous system and sense organs: Secondary | ICD-10-CM

## 2019-07-15 HISTORY — DX: Essential (primary) hypertension: I10

## 2019-07-15 HISTORY — DX: Personal history of other diseases of the nervous system and sense organs: Z86.69

## 2019-07-15 HISTORY — DX: Other allergy status, other than to drugs and biological substances: Z91.09

## 2019-07-15 HISTORY — DX: Generalized anxiety disorder: F41.1

## 2019-07-22 ENCOUNTER — Ambulatory Visit: Payer: Self-pay

## 2019-07-30 ENCOUNTER — Telehealth: Payer: Self-pay | Admitting: Osteopathic Medicine

## 2019-07-30 ENCOUNTER — Ambulatory Visit (INDEPENDENT_AMBULATORY_CARE_PROVIDER_SITE_OTHER): Payer: Self-pay | Admitting: Osteopathic Medicine

## 2019-07-30 ENCOUNTER — Encounter: Payer: Self-pay | Admitting: Osteopathic Medicine

## 2019-07-30 VITALS — Wt 225.0 lb

## 2019-07-30 DIAGNOSIS — Z653 Problems related to other legal circumstances: Secondary | ICD-10-CM

## 2019-07-30 DIAGNOSIS — F411 Generalized anxiety disorder: Secondary | ICD-10-CM

## 2019-07-30 NOTE — Progress Notes (Signed)
Virtual Visit via Video (App used: Doximity) Note  I connected with      Pleas Julian Myers on 07/30/19 at 4:15 PM by a telemedicine application and verified that I am speaking with the correct person using two identifiers.  Patient is at home I am in office   I discussed the limitations of evaluation and management by telemedicine and the availability of in person appointments. The patient expressed understanding and agreed to proceed.  History of Present Illness: Julian Myers is a 53 y.o. male who would like to discuss anxiety, issues involving custody of daughter   Custody battle, causing a lot of anxiety. Concerned for getting his daughter away form his ex-girlfriend, whose new boyfriend reportedly has had issues with the law and history of 4 felony drug charges currently pending, concerned for dangerous environment for his daughter.    Lawyer suggested some anger management classes as well as weekly drug testing  Daughter, in a forensic interview, would get scared when he and her mom would argue. Lawyer suggested some anger management therapies.     Observations/Objective: Wt 225 lb (102.1 kg)   BMI 29.69 kg/m  BP Readings from Last 3 Encounters:  07/14/19 (!) 143/103  02/16/19 120/87  11/11/18 (!) 137/100   Exam: Normal Speech.  NAD  Lab and Radiology Results No results found for this or any previous visit (from the past 72 hour(s)). No results found.     Assessment and Plan: 53 y.o. male with The primary encounter diagnosis was Custody issue. A diagnosis of Anxiety state was also pertinent to this visit.  Will get back to him re: cash price of UDS. Will message our therapist on staff and see if she has any recommendations re: anger management specific referral or if this is something she can manage and we can get him scheduled   PDMP not reviewed this encounter. No orders of the defined types were placed in this encounter.  No orders of the defined types were  placed in this encounter.  There are no Patient Instructions on file for this visit.   Follow Up Instructions: Return for recheck depending on answers form therpaist and from nurseing re: cost of UDS.    I discussed the assessment and treatment plan with the patient. The patient was provided an opportunity to ask questions and all were answered. The patient agreed with the plan and demonstrated an understanding of the instructions.   The patient was advised to call back or seek an in-person evaluation if any new concerns, if symptoms worsen or if the condition fails to improve as anticipated.  25 minutes of non-face-to-face time was provided during this encounter.                      Historical information moved to improve visibility of documentation.  Past Medical History:  Diagnosis Date  . Anxiety   . Hypertension    Past Surgical History:  Procedure Laterality Date  . ANKLE SURGERY    . BACK SURGERY    . NECK SURGERY     Social History   Tobacco Use  . Smoking status: Never Smoker  . Smokeless tobacco: Never Used  Substance Use Topics  . Alcohol use: Yes    Comment: 5 q wk   family history includes Diabetes in his mother; Hypertension in his father and mother.  Medications: Current Outpatient Medications  Medication Sig Dispense Refill  . LORazepam (ATIVAN) 0.5 MG tablet Take 0.5-1  tablets (0.25-0.5 mg total) by mouth 2 (two) times daily as needed for anxiety. #30 FOR 90 DAYS 30 tablet 0  . losartan (COZAAR) 50 MG tablet Take 0.5 tablets (25 mg total) by mouth daily. 90 tablet 1  . SUMAtriptan (IMITREX) 50 MG tablet Take 1 tablet (50 mg total) by mouth once as needed for up to 1 dose for migraine. May repeat in 2 hours if headache persists or recurs. 9 tablet 3   No current facility-administered medications for this visit.    No Known Allergies  PDMP not reviewed this encounter. No orders of the defined types were placed in this  encounter.  No orders of the defined types were placed in this encounter.

## 2019-07-30 NOTE — Telephone Encounter (Signed)
How much is urine drug screen cash price? I placed order. Thanks!

## 2019-07-31 NOTE — Telephone Encounter (Signed)
Price is $93.27 and the lab order [id number].

## 2019-08-01 NOTE — Telephone Encounter (Signed)
OK can we call patient and let him know to come in and get this done (nurse visit ok) and expected cost? Before we forward to his lawyer, he will need to sign a record release giving [place] the ok to do so, he can do this at the visit. Thanks!

## 2019-08-04 ENCOUNTER — Telehealth: Payer: Self-pay | Admitting: Osteopathic Medicine

## 2019-08-04 NOTE — Telephone Encounter (Signed)
Patient needs a drug test done at either [facility] or [facility] (he isn't sure which one) for a child custody case. He was told he could ask his PCP for this.

## 2019-08-04 NOTE — Telephone Encounter (Signed)
I emailed Julian Myers about this. She said she would have to ask a colleague about this, and currently I'm waiting to hear back from her. She does not specifically address anger management. Patient's lawyer suggested anger management, perhaps the lawyer has some resources?   I am happy to order hair drug testing if he requests.

## 2019-08-04 NOTE — Telephone Encounter (Signed)
Julian Myers called and states he is still waiting on the referral to the therapist,, for anger management. He wanted to know if [facility] will do hair follicle drug testing. I gave him the number to [facility] to check to see if they do a hair follicle drug test. He will call back to let us know which test he would like to order.     Patient advised of below.

## 2019-08-05 NOTE — Telephone Encounter (Signed)
Pt advised. Is going to get drug test. Gave web-site info to look up councilor

## 2019-08-05 NOTE — Telephone Encounter (Signed)
See other note-pt set up for drug screen and was given resources to find [employer]

## 2019-08-05 NOTE — Telephone Encounter (Signed)
Orders are already in for drug test for [facility].   I also got a message back from [place] our therapist. No one at [facility] deals specifically with legal issues involving anger management, though they are happy to see him for general mental health / anxiety issues. Janett Billow recommended he look up therapists with a background in family trauma. She referenced the following web site:   [email or web address]

## 2019-08-05 NOTE — Telephone Encounter (Signed)
Left VM for Pt to return clinic call.  

## 2019-08-15 ENCOUNTER — Emergency Department
Admission: EM | Admit: 2019-08-15 | Discharge: 2019-08-15 | Disposition: A | Discharge status: Home / Self Care | Payer: Self-pay | Source: Home / Self Care | Attending: Emergency Medicine | Admitting: Emergency Medicine

## 2019-08-15 ENCOUNTER — Other Ambulatory Visit: Payer: Self-pay

## 2019-08-15 DIAGNOSIS — R519 Headache, unspecified: Secondary | ICD-10-CM

## 2019-08-15 DIAGNOSIS — R51 Headache: Secondary | ICD-10-CM

## 2019-08-15 MED ORDER — HYDROCODONE-ACETAMINOPHEN 5-325 MG PO TABS: 1.0000 | ORAL_TABLET | Freq: Four times a day (QID) | ORAL | 0 refills | Status: DC | PRN

## 2019-08-15 MED ORDER — FLUTICASONE PROPIONATE 50 MCG/ACT NA SUSP: NASAL | 0 refills | Status: DC

## 2019-08-15 MED ORDER — ONDANSETRON 4 MG PO TBDP: 4.0000 mg | ORAL_TABLET | Freq: Three times a day (TID) | ORAL | 0 refills | Status: DC | PRN

## 2019-08-15 MED ORDER — PREDNISONE 10 MG (21) PO TBPK: ORAL_TABLET | ORAL | 0 refills | Status: DC

## 2019-08-15 MED ORDER — METHYLPREDNISOLONE ACETATE 80 MG/ML IJ SUSP
80.0000 mg | Freq: Once | INTRAMUSCULAR | Status: AC
  Administered 2019-08-15: 80 mg via INTRAMUSCULAR

## 2019-08-15 NOTE — ED Triage Notes (Signed)
Pt c/o headache and sinus/ear pain x 2 weeks. Hx of sinusitis. Pt works around [employer]. Taking advil cold and sinus prn. Pain 8/10

## 2019-08-15 NOTE — Discharge Instructions (Addendum)
You likely have a sinus headache, possibly in combination with migraine. Today we're giving you a shot of Depo-Medrol. Prescription sent to your pharmacy: Prednisone Dosepak .    and a small amount of Vicodin if needed for severe pain.   Zofran if needed for nausea Continue using Flonase. And use Imitrex that you have at home as needed for migraine flareup. You need to follow-up with your PCP within 1 week to recheck, sooner if worse or new symptoms.

## 2019-08-15 NOTE — ED Provider Notes (Signed)
[facility]    CSN: [id number] Arrival date & time: 08/15/19  1220      History   Chief Complaint Chief Complaint  Patient presents with  . Headache  . Facial Pain  . Otalgia    HPI Julian Myers is a 53 y.o. male.   HPI Pt c/o mild-moderate bilateral maxillary, periorbital and frontal headache, associated with bilateral sinus/ear pain x 2 weeks.  Pain has progressed to 7 or 8 out of 10 at times.  He has a history of migraines, Imitrex that he used at home, was mild to moderately effective yesterday.  He denies any aura or focal neurologic symptoms.  No change in vision. Occasionally has nausea but no vomiting or abdominal pain or diarrhea. Hx of sinusitis in the past, but he denies discolored rhinorrhea or fever or chills.  He denies any acute arthralgias or myalgias.  No cough or chest pain or shortness of breath.  No known exposure to COVID.  Pt works around [employer]. -He feels with the change of weather, that headache is likely from flareup of sinus allergies, as he has associated sneezing and occasional itchy watery eyes, and nasal congestion and clearish rhinorrhea.   Taking advil cold and sinus prn.  Has started taking Flonase the past day or 2 which he had at home-Helps a little bit. Past Medical History:  Diagnosis Date  . Anxiety   . Hypertension     Patient Active Problem List   Diagnosis Date Noted  . Environmental allergies 07/15/2019  . History of migraine 07/15/2019  . Anxiety state 07/15/2019  . Essential hypertension 07/15/2019    Past Surgical History:  Procedure Laterality Date  . ANKLE SURGERY    . BACK SURGERY    . NECK SURGERY         Home Medications    Prior to Admission medications   Medication Sig Start Date End Date Taking? Authorizing Provider  fluticasone (FLONASE) 50 MCG/ACT nasal spray 1 or 2 sprays each nostril twice a day 08/15/19   Lajean Manes, MD  HYDROcodone-acetaminophen (NORCO/VICODIN) 5-325 MG tablet Take  1-2 tablets by mouth every 6 (six) hours as needed for severe pain. Take with food. 08/15/19   Lajean Manes, MD  LORazepam (ATIVAN) 0.5 MG tablet Take 0.5-1 tablets (0.25-0.5 mg total) by mouth 2 (two) times daily as needed for anxiety. #30 FOR 90 DAYS 07/14/19   Sunnie Nielsen, DO  losartan (COZAAR) 50 MG tablet Take 0.5 tablets (25 mg total) by mouth daily. 07/14/19 07/13/20  Sunnie Nielsen, DO  ondansetron (ZOFRAN-ODT) 4 MG disintegrating tablet Take 1 tablet (4 mg total) by mouth every 8 (eight) hours as needed for nausea or vomiting. 08/15/19   Lajean Manes, MD  predniSONE (STERAPRED UNI-PAK 21 TAB) 10 MG (21) TBPK tablet Take tapering dosage over 6 days as directed 08/15/19   Lajean Manes, MD  SUMAtriptan (IMITREX) 50 MG tablet Take 1 tablet (50 mg total) by mouth once as needed for up to 1 dose for migraine. May repeat in 2 hours if headache persists or recurs. 07/14/19   Sunnie Nielsen, DO    Family History Family History  Problem Relation Age of Onset  . Diabetes Mother   . Hypertension Mother   . Hypertension Father     Social History Social History   Tobacco Use  . Smoking status: Never Smoker  . Smokeless tobacco: Never Used  Substance Use Topics  . Alcohol use: Yes    Comment: 5 q  wk  . Drug use: No     Allergies   Patient has no known allergies.   Review of Systems Review of Systems  All other systems reviewed and are negative.  Pertinent items noted in HPI and remainder of comprehensive ROS otherwise negative.   Physical Exam Triage Vital Signs ED Triage Vitals  Enc Vitals Group     BP 08/15/19 1323 (!) 141/93     Pulse Rate 08/15/19 1323 83     Resp 08/15/19 1323 18     Temp 08/15/19 1323 97.7 F (36.5 C)     Temp Source 08/15/19 1323 Oral     SpO2 08/15/19 1323 96 %     Weight 08/15/19 1324 225 lb (102.1 kg)     Height 08/15/19 1324 6\' 1"  (1.854 m)     Head Circumference --      Peak Flow --      Pain Score 08/15/19 1324 8     Pain Loc  --      Pain Edu? --      Excl. in GC? --    No data found.  Updated Vital Signs BP (!) 141/93 (BP Location: Right Arm)   Pulse 83   Temp 97.7 F (36.5 C) (Oral)   Resp 18   Ht 6\' 1"  (1.854 m)   Wt 102.1 kg   SpO2 96%   BMI 29.69 kg/m   Visual Acuity Right Eye Distance:   Left Eye Distance:   Bilateral Distance:    Right Eye Near:   Left Eye Near:    Bilateral Near:     Physical Exam Vitals signs and nursing note reviewed.  Constitutional:      General: He is not in acute distress.    Appearance: He is well-developed. He is not toxic-appearing.     Comments: Appears uncomfortable from headache, but no acute cardiorespiratory distress.  He is alert and cooperative.  HENT:     Head: Normocephalic and atraumatic.     Right Ear: Tympanic membrane, ear canal and external ear normal.     Left Ear: Tympanic membrane, ear canal and external ear normal.     Nose: Mucosal edema and rhinorrhea (Mild, serous ) present.     Right Sinus: Maxillary sinus tenderness present.     Left Sinus: Maxillary sinus tenderness present.     Mouth/Throat:     Mouth: Mucous membranes are moist. No oral lesions.     Pharynx: No oropharyngeal exudate.     Comments: Mild cobblestoning posterior pharynx Eyes:     General: No scleral icterus.       Right eye: No discharge.        Left eye: No discharge.     Extraocular Movements: Extraocular movements intact.     Pupils: Pupils are equal, round, and reactive to light.  Neck:     Musculoskeletal: Neck supple. No neck rigidity.  Cardiovascular:     Rate and Rhythm: Normal rate and regular rhythm.     Heart sounds: Normal heart sounds.  Pulmonary:     Effort: Pulmonary effort is normal.     Breath sounds: Normal breath sounds. No wheezing or rales.  Abdominal:     General: There is no distension.  Musculoskeletal:        General: No swelling or tenderness.  Lymphadenopathy:     Cervical: No cervical adenopathy.  Skin:    General: Skin is  warm and dry.     Capillary Refill:  Capillary refill takes less than 2 seconds.     Findings: No rash.  Neurological:     Mental Status: He is alert and oriented to person, place, and time.     Cranial Nerves: No cranial nerve deficit.     Sensory: No sensory deficit.     Motor: No weakness.  Psychiatric:        Mood and Affect: Mood normal.      UC Treatments / Results  Labs (all labs ordered are listed, but only abnormal results are displayed) Labs Reviewed - No data to display  EKG   Radiology No results found.  Procedures Procedures (including critical care time)  Medications Ordered in UC Medications  methylPREDNISolone acetate (DEPO-MEDROL) injection 80 mg (80 mg Intramuscular Given 08/15/19 1356)    Initial Impression / Assessment and Plan / UC Course  I have reviewed the triage vital signs and the nursing notes.  Pertinent labs & imaging results that were available during my care of the patient were reviewed by me and considered in my medical decision making (see chart for details).      Final Clinical Impressions(s) / UC Diagnoses   Final diagnoses:  Sinus headache  Severe sinus headache, and migraine component.  No evidence of acute neurologic event.  No evidence of bacterial infection.  Risk benefits alternatives of treatment, discussed with patient at length, see below. Depo-Medrol 80 mg IM stat.   Discharge Instructions     You likely have a sinus headache, possibly in combination with migraine. Today we're giving you a shot of Depo-Medrol. Prescription sent to your pharmacy: Prednisone Dosepak .    and a small amount of Vicodin if needed for severe pain.   Zofran if needed for nausea Continue using Flonase. And use Imitrex that you have at home as needed for migraine flareup. You need to follow-up with your PCP within 1 week to recheck, sooner if worse or new symptoms.    ED Prescriptions    Medication Sig Dispense Auth. Provider    predniSONE (STERAPRED UNI-PAK 21 TAB) 10 MG (21) TBPK tablet Take tapering dosage over 6 days as directed 21 tablet Jacqulyn Cane, MD   HYDROcodone-acetaminophen (NORCO/VICODIN) 5-325 MG tablet Take 1-2 tablets by mouth every 6 (six) hours as needed for severe pain. Take with food. 6 tablet Jacqulyn Cane, MD   fluticasone [facility]) 50 MCG/ACT nasal spray 1 or 2 sprays each nostril twice a day 16 g Jacqulyn Cane, MD   ondansetron (ZOFRAN-ODT) 4 MG disintegrating tablet Take 1 tablet (4 mg total) by mouth every 8 (eight) hours as needed for nausea or vomiting. 5 tablet Jacqulyn Cane, MD     Precautions discussed. Red flags discussed. Questions invited and answered. Patient voiced understanding and agreement.   Controlled Substance Prescriptions [place] Controlled Substance Registry consulted? Yes, I have consulted the [place] Controlled Substances Registry for this patient, and feel the risk/benefit ratio today is favorable for proceeding with this prescription for a controlled substance.   Jacqulyn Cane, MD 08/16/19 [phone]

## 2019-08-18 LAB — PAIN MGMT, PROFILE 5 W/O CONF, U
Amphetamines: NEGATIVE ng/mL
Barbiturates: NEGATIVE ng/mL
Benzodiazepines: NEGATIVE ng/mL
Cocaine Metabolite: NEGATIVE ng/mL
Creatinine: 154.4 mg/dL
Marijuana Metabolite: NEGATIVE ng/mL
Methadone Metabolite: NEGATIVE ng/mL
Opiates: POSITIVE ng/mL
Oxidant: NEGATIVE ug/mL
Oxycodone: NEGATIVE ng/mL
pH: 5.6 (ref 4.5–9.0)

## 2019-10-02 ENCOUNTER — Telehealth: Payer: Self-pay

## 2019-10-02 MED ORDER — LORAZEPAM 0.5 MG PO TABS: 0.2500 mg | ORAL_TABLET | Freq: Two times a day (BID) | ORAL | 0 refills | Status: AC | PRN

## 2019-10-02 NOTE — Telephone Encounter (Signed)
Pt called requesting med refill for lorazepam. Pls send to [facility].

## 2019-10-02 NOTE — Telephone Encounter (Signed)
Last prescription was written for 30 tablets for 90 days, 07/14/19, which would be November 9.  I am okay to send an early refill just this month but he needs to be clear that 30 tablets must last for 90 days to avoid tolerance/dependence on the medication.  No early refills in the future.

## 2019-10-02 NOTE — Telephone Encounter (Signed)
Left a detailed vm msg for pt regarding med refill request and provider's note. Direct call back info provided.

## 2020-10-08 ENCOUNTER — Encounter (HOSPITAL_BASED_OUTPATIENT_CLINIC_OR_DEPARTMENT_OTHER): Payer: Self-pay | Admitting: *Deleted

## 2020-10-08 ENCOUNTER — Emergency Department (HOSPITAL_BASED_OUTPATIENT_CLINIC_OR_DEPARTMENT_OTHER)
Admission: EM | Admit: 2020-10-08 | Discharge: 2020-10-08 | Disposition: A | Discharge status: Home / Self Care | Payer: Self-pay | Attending: Emergency Medicine | Admitting: Emergency Medicine

## 2020-10-08 ENCOUNTER — Other Ambulatory Visit: Payer: Self-pay

## 2020-10-08 DIAGNOSIS — Z79899 Other long term (current) drug therapy: Secondary | ICD-10-CM | POA: Insufficient documentation

## 2020-10-08 DIAGNOSIS — K0889 Other specified disorders of teeth and supporting structures: Secondary | ICD-10-CM | POA: Insufficient documentation

## 2020-10-08 DIAGNOSIS — I10 Essential (primary) hypertension: Secondary | ICD-10-CM | POA: Insufficient documentation

## 2020-10-08 DIAGNOSIS — H7393 Unspecified disorder of tympanic membrane, bilateral: Secondary | ICD-10-CM | POA: Insufficient documentation

## 2020-10-08 MED ORDER — FENTANYL CITRATE (PF) 100 MCG/2ML IJ SOLN
50.0000 ug | Freq: Once | INTRAMUSCULAR | Status: AC
  Administered 2020-10-08: 50 ug via INTRAMUSCULAR
  Filled 2020-10-08: qty 2

## 2020-10-08 MED ORDER — ONDANSETRON 4 MG PO TBDP
4.0000 mg | ORAL_TABLET | Freq: Once | ORAL | Status: AC
  Administered 2020-10-08: 4 mg via ORAL
  Filled 2020-10-08: qty 1

## 2020-10-08 MED ORDER — OXYCODONE-ACETAMINOPHEN 5-325 MG PO TABS: 1.0000 | ORAL_TABLET | Freq: Four times a day (QID) | ORAL | 0 refills | Status: DC | PRN

## 2020-10-08 MED ORDER — ONDANSETRON 4 MG PO TBDP: 4.0000 mg | ORAL_TABLET | Freq: Three times a day (TID) | ORAL | 0 refills | Status: DC | PRN

## 2020-10-08 MED ORDER — OXYCODONE-ACETAMINOPHEN 5-325 MG PO TABS
1.0000 | ORAL_TABLET | Freq: Once | ORAL | Status: AC
  Administered 2020-10-08: 1 via ORAL
  Filled 2020-10-08: qty 1

## 2020-10-08 NOTE — Discharge Instructions (Signed)
Your exam today is reassuring.  I do not see any obvious signs of infection.  I suspect there may be components of inadequate pain control as well as exposed nerve that is contributing to your pain.  We will try a course of oxycodone to see if that helps better manage her pain.  I have additionally provided Zofran to help with nausea/vomiting.  It is important that you follow-up with your dentist.  Call their office on Monday.  Return the emergency department for fever, inability to swallow your saliva, vomiting, difficulty breathing, facial redness, facial swelling or any other worsening or concerning symptoms.

## 2020-10-08 NOTE — ED Provider Notes (Signed)
[facility] EMERGENCY DEPARTMENT Provider Note   CSN: [id number] Arrival date & time: 10/08/20  2106     History Chief Complaint  Patient presents with  . Facial Pain    Julian Myers is a 54 y.o. male past 1 history of anxiety, hypertension who presents for evaluation of right-sided facial pain.  He had a wisdom tooth extraction done on 10/04/2020.  At that time, they were concerned about some associated infection and they started him on clindamycin.  Friend also reports that they wanted to keep the incision open so as not to close any of the infection.  Patient was discharged home with clindamycin and hydrocodone.  He he comes in today because he feels like he is having worsening pain in the right side of his face.  He feels like the pain spreads up his jaw into his ear.  He states that it hurts whenever he tries to move his mouth. He has been taking ibuprofen and Tylenol but states he has not been taking the hydrocodone pain medication because it made him feel dizzy and sick.  He has not noted any fevers.  He feels like his right side of his face had been slightly swollen.  He has not had any difficulty breathing, vomiting.  The history is provided by the patient.       Past Medical History:  Diagnosis Date  . Anxiety   . Hypertension     Patient Active Problem List   Diagnosis Date Noted  . Environmental allergies 07/15/2019  . History of migraine 07/15/2019  . Anxiety state 07/15/2019  . Essential hypertension 07/15/2019    Past Surgical History:  Procedure Laterality Date  . ANKLE SURGERY    . BACK SURGERY    . NECK SURGERY         Family History  Problem Relation Age of Onset  . Diabetes Mother   . Hypertension Mother   . Hypertension Father     Social History   Tobacco Use  . Smoking status: Never Smoker  . Smokeless tobacco: Never Used  Vaping Use  . Vaping Use: Never used  Substance Use Topics  . Alcohol use: Yes    Comment: 5 q wk  . Drug  use: No    Home Medications Prior to Admission medications   Medication Sig Start Date End Date Taking? Authorizing Provider  fluticasone (FLONASE) 50 MCG/ACT nasal spray 1 or 2 sprays each nostril twice a day 08/15/19   Lajean Manes, MD  HYDROcodone-acetaminophen (NORCO/VICODIN) 5-325 MG tablet Take 1-2 tablets by mouth every 6 (six) hours as needed for severe pain. Take with food. 08/15/19   Lajean Manes, MD  losartan (COZAAR) 50 MG tablet Take 0.5 tablets (25 mg total) by mouth daily. 07/14/19 07/13/20  Sunnie Nielsen, DO  ondansetron (ZOFRAN ODT) 4 MG disintegrating tablet Take 1 tablet (4 mg total) by mouth every 8 (eight) hours as needed for nausea or vomiting. 10/08/20   Maxwell Caul, PA-C  oxyCODONE-acetaminophen (PERCOCET/ROXICET) 5-325 MG tablet Take 1-2 tablets by mouth every 6 (six) hours as needed for severe pain. 10/08/20   Maxwell Caul, PA-C  predniSONE (STERAPRED UNI-PAK 21 TAB) 10 MG (21) TBPK tablet Take tapering dosage over 6 days as directed 08/15/19   Lajean Manes, MD  SUMAtriptan (IMITREX) 50 MG tablet Take 1 tablet (50 mg total) by mouth once as needed for up to 1 dose for migraine. May repeat in 2 hours if headache persists or recurs. 07/14/19  Sunnie Nielsen, DO    Allergies    Patient has no known allergies.  Review of Systems   Review of Systems  Constitutional: Negative for fever.  HENT: Positive for dental problem and facial swelling. Negative for trouble swallowing.   Gastrointestinal: Negative for vomiting.  All other systems reviewed and are negative.   Physical Exam Updated Vital Signs BP (!) 172/123   Pulse 93   Temp 99.1 F (37.3 C) (Oral)   Resp 17   Ht 6\' 1"  (1.854 m)   Wt 106.6 kg   SpO2 95%   BMI 31.00 kg/m   Physical Exam Vitals and nursing note reviewed.  Constitutional:      Appearance: He is well-developed.  HENT:     Head: Normocephalic and atraumatic.     Comments: Face is symmetric in appearance without any  overlying warmth, erythema, edema.    Right Ear: Tympanic membrane normal.     Left Ear: Tympanic membrane normal.     Ears:     Comments: Bilateral TMs are without any erythema, effusion.  No warmth, erythema, tenderness of the bilateral mastoid process.    Mouth/Throat:     Comments: Tooth #30 true has been extracted.  There is a healing wound noted.  No surrounding gingival erythema, edema.  No purulent drainage.  No trismus noted on oral exam.  No obvious dental abscess.  Posterior oropharynx is clear without any erythema, edema. Eyes:     General: No scleral icterus.       Right eye: No discharge.        Left eye: No discharge.     Conjunctiva/sclera: Conjunctivae normal.  Pulmonary:     Effort: Pulmonary effort is normal.  Skin:    General: Skin is warm and dry.  Neurological:     Mental Status: He is alert.  Psychiatric:        Speech: Speech normal.        Behavior: Behavior normal.     ED Results / Procedures / Treatments   Labs (all labs ordered are listed, but only abnormal results are displayed) Labs Reviewed - No data to display  EKG None  Radiology No results found.  Procedures Procedures (including critical care time)  Medications Ordered in ED Medications  ondansetron (ZOFRAN-ODT) disintegrating tablet 4 mg (4 mg Oral Given 10/08/20 2157)  fentaNYL (SUBLIMAZE) injection 50 mcg (50 mcg Intramuscular Given 10/08/20 2157)  oxyCODONE-acetaminophen (PERCOCET/ROXICET) 5-325 MG per tablet 1 tablet (1 tablet Oral Given 10/08/20 2224)    ED Course  I have reviewed the triage vital signs and the nursing notes.  Pertinent labs & imaging results that were available during my care of the patient were reviewed by me and considered in my medical decision making (see chart for details).    MDM Rules/Calculators/A&P                          54 year old male who presents for evaluation of right-sided facial pain.  Recent wisdom tooth extraction done on 10/04/2020.  Was  discharged home on clindamycin and hydrocodone.  He does not take the hydrocodone as he feels like it makes him sick and dizzy.  He has been taking Tylenol and ibuprofen but states he has continued to have worsening pain.  He feels like his face is swollen.  No fevers.  On initial ED arrival, he is afebrile, nontoxic-appearing.  Vital signs are stable.  He is tolerating secretions without  any signs of drooling.  No evidence of respiratory distress.  On exam, his face is symmetric in appearance without any overlying warmth, erythema.  No trismus.  His right wisdom tooth on the lower side has been extracted and there is a healing wound with no purulent drainage.  No surrounding visual erythema, edema.  No identifiable gingival abscess.  TMs and mastoid process are without any abnormalities.  I suspect this may be related to his pain from his tooth extraction.  Question if there is a component of dry socket given the open wound.  Additionally, do not feel that he is getting adequate pain control as he has not been taking any of the pain medication.  At this time, exam is not concerning for infectious process.  Do not see any identifiable dental abscess that would be amenable to I&D here in the ED.  Additionally, history/physical exam not concerning for Ludwig angina, peritonsillar abscess, mastoiditis.  I discussed at length with patient and friend regarding pain control here in the ED. I discussed with him and offered a dental block but patient declined.  We discussed trying oxycodone to see if that would help as well as setting home Zofran.  I instructed patient that he will need to follow-up with his dentist and he is planning on seeing him in 2 days.  At this time, patient has no evidence of respiratory distress.  No signs of infectious etiology.  We will plan to send home a short course of oxycodone to help with pain control.    Patient given fentanyl and oxycodone here in the ED which he states did somewhat  improve his pain.  Still hypertensive here in the ED.  He states that he had a history of high blood pressure and was on lisinopril at one time.  He states that he worked to drop weight and will improve his diet and states that his blood pressure normalized.  He request that I start him back on lisinopril.  I discussed with him that I feel that is tight this time, his blood pressure is likely due to the fact that he is in pain.  I discussed with him that from an emergent standpoint, if his blood pressure is normalized we send him home on blood pressure medication, there is a chance that he could become hypotensive which could cause issues.  At this time, do not feel comfortable starting him on a blood pressure medication given his isolated hypertension here in the ED.  I instructed patient to have his primary care doctor check his blood pressure.  At this time, patient exhibits no emergent life-threatening condition that require further evaluation in ED. Patient had ample opportunity for questions and discussion. All patient's questions were answered with full understanding. Strict return precautions discussed. Patient expresses understanding and agreement to plan.   Portions of this note were generated with Scientist, clinical (histocompatibility and immunogenetics)Dragon dictation software. Dictation errors may occur despite best attempts at proofreading.    Final Clinical Impression(s) / ED Diagnoses Final diagnoses:  Pain, dental    Rx / DC Orders ED Discharge Orders         Ordered    oxyCODONE-acetaminophen (PERCOCET/ROXICET) 5-325 MG tablet  Every 6 hours PRN        10/08/20 2205    ondansetron (ZOFRAN ODT) 4 MG disintegrating tablet  Every 8 hours PRN        10/08/20 2205           Maxwell CaulLayden, Kaeleigh Westendorf A, PA-C 10/08/20  3295    Rolan Bucco, MD 10/08/20 [phone]

## 2020-10-08 NOTE — ED Triage Notes (Addendum)
Pt reports he had dental extraction this week. He has been on clindamycin. Reports increased pain tonight in right ear and jaw

## 2020-10-09 ENCOUNTER — Other Ambulatory Visit: Payer: Self-pay

## 2020-10-09 ENCOUNTER — Encounter (HOSPITAL_BASED_OUTPATIENT_CLINIC_OR_DEPARTMENT_OTHER): Payer: Self-pay | Admitting: Emergency Medicine

## 2020-10-09 ENCOUNTER — Emergency Department (HOSPITAL_BASED_OUTPATIENT_CLINIC_OR_DEPARTMENT_OTHER)
Admission: EM | Admit: 2020-10-09 | Discharge: 2020-10-09 | Disposition: A | Discharge status: Home / Self Care | Payer: Self-pay | Attending: Emergency Medicine | Admitting: Emergency Medicine

## 2020-10-09 DIAGNOSIS — Z79899 Other long term (current) drug therapy: Secondary | ICD-10-CM | POA: Insufficient documentation

## 2020-10-09 DIAGNOSIS — F410 Panic disorder [episodic paroxysmal anxiety] without agoraphobia: Secondary | ICD-10-CM | POA: Insufficient documentation

## 2020-10-09 DIAGNOSIS — I10 Essential (primary) hypertension: Secondary | ICD-10-CM | POA: Insufficient documentation

## 2020-10-09 MED ORDER — DIPHENHYDRAMINE HCL 50 MG/ML IJ SOLN
25.0000 mg | Freq: Once | INTRAMUSCULAR | Status: AC
  Administered 2020-10-09: 25 mg via INTRAMUSCULAR
  Filled 2020-10-09: qty 1

## 2020-10-09 NOTE — ED Notes (Signed)
Nurse Secretary radioed this RN and said pt visitor said "he feels better now, can I take him home?" NS asked if pt visitor was driving, was told yes, and NS said she would radio this RN; when this RN went to assess pt less than 2 min after being called, pt was gone; EDP made aware

## 2020-10-09 NOTE — Discharge Instructions (Addendum)
Akathisia:  Overview: A feeling of muscle quivering, restlessness, and inability to sit still, sometimes a side effect of antipsychotic or antidepressant medication.  Self-treatment: Using hot or cold packs on the affected area and practicing relaxation exercises may help ease symptoms. Avoiding caffeine, alcohol, and tobacco may also help.  Seeking medical care: -Make an appointment to see a doctor if your symptoms -Affect social situations -Disrupt your sleep -Get worse

## 2020-10-09 NOTE — ED Provider Notes (Signed)
[facility] EMERGENCY DEPARTMENT Provider Note  CSN: [id number] Arrival date & time: 10/09/20 8295  Chief Complaint(s) Anxiety  HPI Julian Myers is a 54 y.o. male   The history is provided by the patient.  Anxiety This is a recurrent problem. The current episode started 3 to 5 hours ago. The problem occurs constantly. The problem has not changed since onset.Pertinent negatives include no chest pain, no abdominal pain, no headaches and no shortness of breath. Nothing relieves the symptoms. He has tried nothing for the symptoms.   Believes it's from taking the percocet given to him here during the last visit. Reports similar symptoms with taking other narcotic pain medicine. Feels like he can't sit still and is unable to sleep.  Did not take his Rx'd ativan, as he does not like mix medications.  Past Medical History Past Medical History:  Diagnosis Date  . Anxiety   . Hypertension    Patient Active Problem List   Diagnosis Date Noted  . Environmental allergies 07/15/2019  . History of migraine 07/15/2019  . Anxiety state 07/15/2019  . Essential hypertension 07/15/2019   Home Medication(s) Prior to Admission medications   Medication Sig Start Date End Date Taking? Authorizing Provider  fluticasone (FLONASE) 50 MCG/ACT nasal spray 1 or 2 sprays each nostril twice a day 08/15/19   Lajean Manes, MD  HYDROcodone-acetaminophen (NORCO/VICODIN) 5-325 MG tablet Take 1-2 tablets by mouth every 6 (six) hours as needed for severe pain. Take with food. 08/15/19   Lajean Manes, MD  losartan (COZAAR) 50 MG tablet Take 0.5 tablets (25 mg total) by mouth daily. 07/14/19 07/13/20  Sunnie Nielsen, DO  ondansetron (ZOFRAN ODT) 4 MG disintegrating tablet Take 1 tablet (4 mg total) by mouth every 8 (eight) hours as needed for nausea or vomiting. 10/08/20   Maxwell Caul, PA-C  oxyCODONE-acetaminophen (PERCOCET/ROXICET) 5-325 MG tablet Take 1-2 tablets by mouth every 6 (six) hours as  needed for severe pain. 10/08/20   Maxwell Caul, PA-C  predniSONE (STERAPRED UNI-PAK 21 TAB) 10 MG (21) TBPK tablet Take tapering dosage over 6 days as directed 08/15/19   Lajean Manes, MD  SUMAtriptan (IMITREX) 50 MG tablet Take 1 tablet (50 mg total) by mouth once as needed for up to 1 dose for migraine. May repeat in 2 hours if headache persists or recurs. 07/14/19   Sunnie Nielsen, DO                                                                                                                                    Past Surgical History Past Surgical History:  Procedure Laterality Date  . ANKLE SURGERY    . BACK SURGERY    . NECK SURGERY     Family History Family History  Problem Relation Age of Onset  . Diabetes Mother   . Hypertension Mother   . Hypertension Father     Social History Social  History   Tobacco Use  . Smoking status: Never Smoker  . Smokeless tobacco: Never Used  Vaping Use  . Vaping Use: Never used  Substance Use Topics  . Alcohol use: Yes    Comment: 5 q wk  . Drug use: No   Allergies Patient has no known allergies.  Review of Systems Review of Systems  Respiratory: Negative for shortness of breath.   Cardiovascular: Negative for chest pain.  Gastrointestinal: Negative for abdominal pain.  Neurological: Negative for headaches.   All other systems are reviewed and are negative for acute change except as noted in the HPI  Physical Exam Vital Signs  I have reviewed the triage vital signs BP (!) 178/116   Pulse 83   Temp 97.7 F (36.5 C) (Oral)   Resp 20   Ht 6\' 1"  (1.854 m)   Wt 106.6 kg   SpO2 100%   BMI 31.00 kg/m   Physical Exam Vitals reviewed.  Constitutional:      General: He is not in acute distress.    Appearance: He is well-developed. He is not diaphoretic.  HENT:     Head: Normocephalic and atraumatic.     Nose: Nose normal.  Eyes:     General: No scleral icterus.       Right eye: No discharge.        Left eye: No  discharge.     Conjunctiva/sclera: Conjunctivae normal.     Pupils: Pupils are equal, round, and reactive to light.  Cardiovascular:     Rate and Rhythm: Normal rate and regular rhythm.     Heart sounds: No murmur heard.  No friction rub. No gallop.   Pulmonary:     Effort: Pulmonary effort is normal. No respiratory distress.     Breath sounds: Normal breath sounds. No stridor. No rales.  Abdominal:     General: There is no distension.     Palpations: Abdomen is soft.     Tenderness: There is no abdominal tenderness.  Musculoskeletal:        General: No tenderness.     Cervical back: Normal range of motion and neck supple.  Skin:    General: Skin is warm and dry.     Findings: No erythema or rash.  Neurological:     Mental Status: He is alert and oriented to person, place, and time.  Psychiatric:        Mood and Affect: Mood is anxious.     ED Results and Treatments Labs (all labs ordered are listed, but only abnormal results are displayed) Labs Reviewed - No data to display                                                                                                                       EKG  EKG Interpretation  Date/Time:    Ventricular Rate:    PR Interval:    QRS Duration:   QT Interval:    QTC Calculation:  R Axis:     Text Interpretation:        Radiology No results found.  Pertinent labs & imaging results that were available during my care of the patient were reviewed by me and considered in my medical decision making (see chart for details).  Medications Ordered in ED Medications  diphenhydrAMINE (BENADRYL) injection 25 mg (25 mg Intramuscular Given 10/09/20 0410)                                                                                                                                    Procedures Procedures  (including critical care time)  Medical Decision Making / ED Course I have reviewed the nursing notes for this encounter and the  patient's prior records (if available in EHR or on provided paperwork).   Julian Myers was evaluated in Emergency Department on 10/09/2020 for the symptoms described in the history of present illness. He was evaluated in the context of the global COVID-19 pandemic, which necessitated consideration that the patient might be at risk for infection with the SARS-CoV-2 virus that causes COVID-19. Institutional protocols and algorithms that pertain to the evaluation of patients at risk for COVID-19 are in a state of rapid change based on information released by regulatory bodies including the CDC and federal and state organizations. These policies and algorithms were followed during the patient's care in the ED.  Panic attack vs akathisia  No respiratory distress. No chest pain.  Given IM benadryl, placed Lavender essential oil on cotton swab in the room, played alpha waves music, and turned light down. Allowed to rest.  Reported feeling better after management. Went to Estate agent and asked to leave. Left prior to getting DC papers.      Final Clinical Impression(s) / ED Diagnoses Final diagnoses:  Panic attack   The patient appears reasonably screened and/or stabilized for discharge and I doubt any other medical condition or other [facility] requiring further screening, evaluation, or treatment in the ED at this time prior to discharge. Safe for discharge with strict return precautions.  Disposition: Discharge  Condition: Good  Follow Up: Sunnie Nielsen, DO [address] [facility] [address] [place] [address] [phone]  Call  As needed      This chart was dictated using voice recognition software.  Despite best efforts to proofread,  errors can occur which can change the documentation meaning.   Nira Conn, MD 10/09/20 [phone]

## 2020-10-09 NOTE — ED Triage Notes (Signed)
Pt was seen in this ED last night for a dental issue. Pt states he is having a reaction to the pain medication he was given. He states he is having severe anxiety.

## 2021-04-11 ENCOUNTER — Emergency Department (HOSPITAL_BASED_OUTPATIENT_CLINIC_OR_DEPARTMENT_OTHER)
Admission: EM | Admit: 2021-04-11 | Discharge: 2021-04-11 | Disposition: A | Discharge status: Home / Self Care | Payer: Self-pay | Attending: Emergency Medicine | Admitting: Emergency Medicine

## 2021-04-11 ENCOUNTER — Other Ambulatory Visit: Payer: Self-pay

## 2021-04-11 ENCOUNTER — Encounter (HOSPITAL_BASED_OUTPATIENT_CLINIC_OR_DEPARTMENT_OTHER): Payer: Self-pay

## 2021-04-11 DIAGNOSIS — S30861A Insect bite (nonvenomous) of abdominal wall, initial encounter: Secondary | ICD-10-CM | POA: Insufficient documentation

## 2021-04-11 DIAGNOSIS — M7918 Myalgia, other site: Secondary | ICD-10-CM | POA: Insufficient documentation

## 2021-04-11 DIAGNOSIS — W57XXXA Bitten or stung by nonvenomous insect and other nonvenomous arthropods, initial encounter: Secondary | ICD-10-CM | POA: Insufficient documentation

## 2021-04-11 DIAGNOSIS — I10 Essential (primary) hypertension: Secondary | ICD-10-CM | POA: Insufficient documentation

## 2021-04-11 DIAGNOSIS — Z79899 Other long term (current) drug therapy: Secondary | ICD-10-CM | POA: Insufficient documentation

## 2021-04-11 MED ORDER — DOXYCYCLINE HYCLATE 100 MG PO CAPS
100.0000 mg | ORAL_CAPSULE | Freq: Two times a day (BID) | ORAL | 0 refills | Status: DC
Start: 2021-04-11 — End: 2023-01-17

## 2021-04-11 NOTE — ED Triage Notes (Signed)
Pt c/o joint pain, swelling to left lower abd/groin, leg x 2 days-states he removed tick from left lower abd 4 days ago-NAD-steady gait

## 2021-04-12 ENCOUNTER — Telehealth: Payer: Self-pay | Admitting: General Practice

## 2021-04-12 NOTE — Telephone Encounter (Signed)
Transition Care Management Follow-up Telephone Call  Date of discharge and from where: [facility] 04/11/21  How have you been since you were released from the hospital? Doing ok. Patient has a different PCP.  Any questions or concerns? No

## 2021-04-12 NOTE — ED Provider Notes (Signed)
[facility] EMERGENCY DEPARTMENT Provider Note   CSN: [id number] Arrival date & time: 04/11/21  2253     History Chief Complaint  Patient presents with  . Joint Pain    Tick bite     Julian Myers is a 55 y.o. male.  HPI Patient presents with myalgias joint pain back pain leg pain.  On the lower abdomen around 4 days ago he pulled off a tick.  There is redness surrounding it now.  States he feels bad all over since then.  No chest pain.  History of hypertension and has been taking his medicines.  States he has had some chills but not had a frank fever.  He states there is a swelling in his left groin area also.    Past Medical History:  Diagnosis Date  . Anxiety   . Hypertension     Patient Active Problem List   Diagnosis Date Noted  . Environmental allergies 07/15/2019  . History of migraine 07/15/2019  . Anxiety state 07/15/2019  . Essential hypertension 07/15/2019    Past Surgical History:  Procedure Laterality Date  . ANKLE SURGERY    . BACK SURGERY    . NECK SURGERY         Family History  Problem Relation Age of Onset  . Diabetes Mother   . Hypertension Mother   . Hypertension Father     Social History   Tobacco Use  . Smoking status: Never Smoker  . Smokeless tobacco: Never Used  Vaping Use  . Vaping Use: Never used  Substance Use Topics  . Alcohol use: Yes    Comment: occ  . Drug use: No    Home Medications Prior to Admission medications   Medication Sig Start Date End Date Taking? Authorizing Provider  doxycycline (VIBRAMYCIN) 100 MG capsule Take 1 capsule (100 mg total) by mouth 2 (two) times daily. 04/11/21  Yes Benjiman Core, MD  fluticasone Aleda Grana) 50 MCG/ACT nasal spray 1 or 2 sprays each nostril twice a day 08/15/19   Lajean Manes, MD  HYDROcodone-acetaminophen (NORCO/VICODIN) 5-325 MG tablet Take 1-2 tablets by mouth every 6 (six) hours as needed for severe pain. Take with food. 08/15/19   Lajean Manes, MD  losartan  (COZAAR) 50 MG tablet Take 0.5 tablets (25 mg total) by mouth daily. 07/14/19 07/13/20  Sunnie Nielsen, DO  ondansetron (ZOFRAN ODT) 4 MG disintegrating tablet Take 1 tablet (4 mg total) by mouth every 8 (eight) hours as needed for nausea or vomiting. 10/08/20   Maxwell Caul, PA-C  oxyCODONE-acetaminophen (PERCOCET/ROXICET) 5-325 MG tablet Take 1-2 tablets by mouth every 6 (six) hours as needed for severe pain. 10/08/20   Maxwell Caul, PA-C  predniSONE (STERAPRED UNI-PAK 21 TAB) 10 MG (21) TBPK tablet Take tapering dosage over 6 days as directed 08/15/19   Lajean Manes, MD  SUMAtriptan (IMITREX) 50 MG tablet Take 1 tablet (50 mg total) by mouth once as needed for up to 1 dose for migraine. May repeat in 2 hours if headache persists or recurs. 07/14/19   Sunnie Nielsen, DO    Allergies    Patient has no known allergies.  Review of Systems   Review of Systems  Constitutional: Positive for chills and fatigue.  HENT: Negative for congestion.   Respiratory: Negative for shortness of breath.   Gastrointestinal: Negative for abdominal pain.  Genitourinary: Negative for flank pain.  Musculoskeletal: Positive for arthralgias and myalgias. Negative for joint swelling.  Skin: Positive for rash and  wound.  Neurological: Negative for weakness.  Psychiatric/Behavioral: Negative for confusion.    Physical Exam Updated Vital Signs BP (!) 175/110   Pulse 98   Temp 98 F (36.7 C) (Oral)   Resp 16   Ht 6\' 1"  (1.854 m)   Wt 108.9 kg   SpO2 97%   BMI 31.66 kg/m   Physical Exam Vitals and nursing note reviewed.  HENT:     Head: Atraumatic.  Eyes:     Pupils: Pupils are equal, round, and reactive to light.  Cardiovascular:     Rate and Rhythm: Normal rate.  Pulmonary:     Breath sounds: No wheezing.  Abdominal:     Tenderness: There is no abdominal tenderness.     Comments: And left lower quadrant there is a central scabbed area surrounded by approximately 4 cm area of erythema.   No fluctuance.  Genitourinary:    Comments: Left groin area has mass that is likely a lymph node.  No hernia palpated. Musculoskeletal:     Cervical back: Neck supple.  Skin:    General: Skin is warm.     Capillary Refill: Capillary refill takes less than 2 seconds.  Neurological:     Mental Status: He is alert and oriented to person, place, and time.     ED Results / Procedures / Treatments   Labs (all labs ordered are listed, but only abnormal results are displayed) Labs Reviewed - No data to display  EKG None  Radiology No results found.  Procedures Procedures   Medications Ordered in ED Medications - No data to display  ED Course  I have reviewed the triage vital signs and the nursing notes.  Pertinent labs & imaging results that were available during my care of the patient were reviewed by me and considered in my medical decision making (see chart for details).    MDM Rules/Calculators/A&P                          Patient with myalgias rash and pain after tick bite.  Does not appear to be definitely a cellulitis/abscess but I think he needs treatment for tick based illness.  We will treat with doxycycline.  PCP follow-up as needed Final Clinical Impression(s) / ED Diagnoses Final diagnoses:  Tick bite of abdominal wall, initial encounter    Rx / DC Orders ED Discharge Orders         Ordered    doxycycline (VIBRAMYCIN) 100 MG capsule  2 times daily        04/11/21 2348           06/11/21, MD 04/12/21 [phone]

## 2022-04-13 ENCOUNTER — Emergency Department (HOSPITAL_BASED_OUTPATIENT_CLINIC_OR_DEPARTMENT_OTHER)
Admission: EM | Admit: 2022-04-13 | Discharge: 2022-04-13 | Disposition: A | Discharge status: Home / Self Care | Payer: Self-pay | Attending: Emergency Medicine | Admitting: Emergency Medicine

## 2022-04-13 ENCOUNTER — Emergency Department (HOSPITAL_BASED_OUTPATIENT_CLINIC_OR_DEPARTMENT_OTHER): Payer: Self-pay

## 2022-04-13 ENCOUNTER — Other Ambulatory Visit: Payer: Self-pay

## 2022-04-13 ENCOUNTER — Encounter (HOSPITAL_BASED_OUTPATIENT_CLINIC_OR_DEPARTMENT_OTHER): Payer: Self-pay | Admitting: Emergency Medicine

## 2022-04-13 DIAGNOSIS — Z79899 Other long term (current) drug therapy: Secondary | ICD-10-CM | POA: Insufficient documentation

## 2022-04-13 DIAGNOSIS — U071 COVID-19: Secondary | ICD-10-CM | POA: Insufficient documentation

## 2022-04-13 DIAGNOSIS — I10 Essential (primary) hypertension: Secondary | ICD-10-CM | POA: Insufficient documentation

## 2022-04-13 LAB — CBC WITH DIFFERENTIAL/PLATELET
Abs Immature Granulocytes: 0.02 10*3/uL (ref 0.00–0.07)
Basophils Absolute: 0 10*3/uL (ref 0.0–0.1)
Basophils Relative: 0 %
Eosinophils Absolute: 0.1 10*3/uL (ref 0.0–0.5)
Eosinophils Relative: 2 %
HCT: 44.6 % (ref 39.0–52.0)
Hemoglobin: 15.7 g/dL (ref 13.0–17.0)
Immature Granulocytes: 0 %
Lymphocytes Relative: 27 %
Lymphs Abs: 1.6 10*3/uL (ref 0.7–4.0)
MCH: 31.6 pg (ref 26.0–34.0)
MCHC: 35.2 g/dL (ref 30.0–36.0)
MCV: 89.7 fL (ref 80.0–100.0)
Monocytes Absolute: 0.6 10*3/uL (ref 0.1–1.0)
Monocytes Relative: 11 %
Neutro Abs: 3.6 10*3/uL (ref 1.7–7.7)
Neutrophils Relative %: 60 %
Platelets: 165 10*3/uL (ref 150–400)
RBC: 4.97 MIL/uL (ref 4.22–5.81)
RDW: 12.7 % (ref 11.5–15.5)
WBC: 6 10*3/uL (ref 4.0–10.5)
nRBC: 0 % (ref 0.0–0.2)

## 2022-04-13 LAB — BASIC METABOLIC PANEL
Anion gap: 8 (ref 5–15)
BUN: 27 mg/dL — ABNORMAL HIGH (ref 6–20)
CO2: 23 mmol/L (ref 22–32)
Calcium: 9.3 mg/dL (ref 8.9–10.3)
Chloride: 107 mmol/L (ref 98–111)
Creatinine, Ser: 1.97 mg/dL — ABNORMAL HIGH (ref 0.61–1.24)
GFR, Estimated: 40 mL/min — ABNORMAL LOW (ref >60)
Glucose, Bld: 96 mg/dL (ref 70–99)
Potassium: 4 mmol/L (ref 3.5–5.1)
Sodium: 138 mmol/L (ref 135–145)

## 2022-04-13 LAB — RESP PANEL BY RT-PCR (FLU A&B, COVID) ARPGX2
Influenza A by PCR: NEGATIVE
Influenza B by PCR: NEGATIVE
SARS Coronavirus 2 by RT PCR: POSITIVE — AB

## 2022-04-13 MED ORDER — NIRMATRELVIR/RITONAVIR (PAXLOVID) TABLET (RENAL DOSING): 2.0000 | ORAL_TABLET | Freq: Two times a day (BID) | ORAL | 0 refills | Status: AC

## 2022-04-13 NOTE — ED Notes (Signed)
Pt. In no distress just feeling bad  ?

## 2022-04-13 NOTE — Discharge Instructions (Addendum)
You were diagnosed today with COVID-19.  I prescribed an antiviral to be taken as prescribed.  You may take Tylenol and Advil as needed for symptom management.  Recommend follow-up with PCP as needed ?

## 2022-04-13 NOTE — ED Triage Notes (Signed)
Pt vistied family and they tested positive, pt started feelling sick yesterday with chills, aches, cough. ?

## 2022-04-13 NOTE — ED Provider Notes (Signed)
?[facility] EMERGENCY DEPARTMENT ?Provider Note ? ? ?CSN: [id number] ?Arrival date & time: 04/13/22  2004 ? ?  ? ?History ? ?Chief Complaint  ?Patient presents with  ? URI  ? ? ?Julian Myers is a 56 y.o. male.  Patient presents to the hospital complaining of chills, body aches, and cough that began yesterday.  Patient visited family a few days ago who tested positive for COVID.  Patient denies fever at this time.  Denies abdominal pain, nausea, vomiting.  Endorses cough, body aches, chills.  Denies chest pain.  Past medical history significant for hypertension, anxiety ? ?HPI ? ?  ? ?Home Medications ?Prior to Admission medications   ?Medication Sig Start Date End Date Taking? Authorizing Provider  ?nirmatrelvir/ritonavir EUA, renal dosing, (PAXLOVID) 10 x 150 MG & 10 x 100MG  TABS Take 2 tablets by mouth 2 (two) times daily for 5 days. Patient GFR is 40. Take nirmatrelvir (150 mg) one tablet twice daily for 5 days and ritonavir (100 mg) one tablet twice daily for 5 days. 04/13/22 04/18/22 Yes 04/20/22, PA-C  ?doxycycline (VIBRAMYCIN) 100 MG capsule Take 1 capsule (100 mg total) by mouth 2 (two) times daily. 04/11/21   06/11/21, MD  ?fluticasone Benjiman Core) 50 MCG/ACT nasal spray 1 or 2 sprays each nostril twice a day 08/15/19   10/15/19, MD  ?HYDROcodone-acetaminophen (NORCO/VICODIN) 5-325 MG tablet Take 1-2 tablets by mouth every 6 (six) hours as needed for severe pain. Take with food. 08/15/19   10/15/19, MD  ?losartan (COZAAR) 50 MG tablet Take 0.5 tablets (25 mg total) by mouth daily. 07/14/19 07/13/20  09/12/20, DO  ?ondansetron (ZOFRAN ODT) 4 MG disintegrating tablet Take 1 tablet (4 mg total) by mouth every 8 (eight) hours as needed for nausea or vomiting. 10/08/20   13/6/21, PA-C  ?oxyCODONE-acetaminophen (PERCOCET/ROXICET) 5-325 MG tablet Take 1-2 tablets by mouth every 6 (six) hours as needed for severe pain. 10/08/20   13/6/21, PA-C  ?predniSONE  (STERAPRED UNI-PAK 21 TAB) 10 MG (21) TBPK tablet Take tapering dosage over 6 days as directed 08/15/19   10/15/19, MD  ?SUMAtriptan (IMITREX) 50 MG tablet Take 1 tablet (50 mg total) by mouth once as needed for up to 1 dose for migraine. May repeat in 2 hours if headache persists or recurs. 07/14/19   09/13/19, DO  ?   ? ?Allergies    ?Patient has no known allergies.   ? ?Review of Systems   ?Review of Systems  ?Constitutional:  Positive for chills. Negative for fever.  ?Respiratory:  Positive for cough. Negative for shortness of breath and wheezing.   ?Cardiovascular:  Negative for chest pain.  ?Gastrointestinal:  Negative for abdominal pain, nausea and vomiting.  ?Musculoskeletal:  Positive for myalgias.  ?Neurological:  Negative for headaches.  ? ?Physical Exam ?Updated Vital Signs ?BP (!) 143/99   Pulse 90   Temp 98.3 ?F (36.8 ?C) (Oral)   Resp 18   Ht 6\' 1"  (1.854 m)   Wt 102.1 kg   SpO2 100%   BMI 29.69 kg/m?  ?Physical Exam ?Vitals and nursing note reviewed.  ?Constitutional:   ?   Appearance: He is normal weight. He is ill-appearing.  ?HENT:  ?   Head: Normocephalic and atraumatic.  ?   Nose: Nose normal.  ?   Mouth/Throat:  ?   Mouth: Mucous membranes are moist.  ?Eyes:  ?   Conjunctiva/sclera: Conjunctivae normal.  ?Cardiovascular:  ?  Rate and Rhythm: Normal rate and regular rhythm.  ?   Pulses: Normal pulses.  ?   Heart sounds: Normal heart sounds.  ?Pulmonary:  ?   Effort: Pulmonary effort is normal.  ?   Breath sounds: Normal breath sounds.  ?Abdominal:  ?   Palpations: Abdomen is soft.  ?   Tenderness: There is no abdominal tenderness.  ?Musculoskeletal:  ?   Cervical back: Normal range of motion and neck supple.  ?Skin: ?   General: Skin is warm and dry.  ?Neurological:  ?   Mental Status: He is alert and oriented to person, place, and time.  ? ? ?ED Results / Procedures / Treatments   ?Labs ?(all labs ordered are listed, but only abnormal results are displayed) ?Labs Reviewed   ?RESP PANEL BY RT-PCR (FLU A&B, COVID) ARPGX2 - Abnormal; Notable for the following components:  ?    Result Value  ? SARS Coronavirus 2 by RT PCR POSITIVE (*)   ? All other components within normal limits  ?BASIC METABOLIC PANEL - Abnormal; Notable for the following components:  ? BUN 27 (*)   ? Creatinine, Ser 1.97 (*)   ? GFR, Estimated 40 (*)   ? All other components within normal limits  ?CBC WITH DIFFERENTIAL/PLATELET  ? ? ?EKG ?None ? ?Radiology ?DG Chest Port 1 View ? ?Result Date: 04/13/2022 ?CLINICAL DATA:  COVID positive. EXAM: PORTABLE CHEST 1 VIEW COMPARISON:  None Available. FINDINGS: The heart size and mediastinal contours are within normal limits. Both lungs are clear. The visualized skeletal structures are unremarkable. IMPRESSION: No active disease. Electronically Signed   By: Aram Candela M.D.   On: 04/13/2022 21:52   ? ?Procedures ?Procedures  ? ? ?Medications Ordered in ED ?Medications - No data to display ? ?ED Course/ Medical Decision Making/ A&P ?  ?                        ?Medical Decision Making ?Amount and/or Complexity of Data Reviewed ?Labs: ordered. ?Radiology: ordered. ? ? ?This patient presents to the ED for concern of body aches, chills, and cough.  Differential includes COVID infection, influenza, and other viral infections. ? ? ?Co morbidities that complicate the patient evaluation ? ?None ? ? ?Lab Tests: ? ?I Ordered, and personally interpreted labs.  The pertinent results include: COVID-positive, creatinine 1.97 ? ? ?Imaging Studies ordered: ? ?I ordered imaging studies including chest x-ray ?I independently visualized and interpreted imaging which showed no acute disease ?I agree with the radiologist interpretation ? ?Test / Admission - Considered: ? ?The patient's work-up was reassuring for no pneumonia, no signs of respiratory failure.  The patient does have COVID-19.  There is no indication for admission at this time.  Plan to discharge the patient home with a Paxlovid  prescription.  Patient will also treat symptoms with Tylenol and Advil as needed.  Discharge home. ? ? ?Final Clinical Impression(s) / ED Diagnoses ?Final diagnoses:  ?COVID-19  ? ? ?Rx / DC Orders ?ED Discharge Orders   ? ?      Ordered  ?  nirmatrelvir/ritonavir EUA, renal dosing, (PAXLOVID) 10 x 150 MG & 10 x 100MG  TABS  2 times daily       ? 04/13/22 2241  ? ?  ?  ? ?  ? ? ?  ?2242, PA-C ?04/13/22 2242 ? ?  ?2243, MD ?04/18/22 1542 ? ?

## 2022-08-30 ENCOUNTER — Emergency Department (HOSPITAL_BASED_OUTPATIENT_CLINIC_OR_DEPARTMENT_OTHER)
Admission: EM | Admit: 2022-08-30 | Discharge: 2022-08-30 | Disposition: A | Discharge status: Home / Self Care | Payer: Self-pay | Attending: Emergency Medicine | Admitting: Emergency Medicine

## 2022-08-30 ENCOUNTER — Emergency Department (HOSPITAL_BASED_OUTPATIENT_CLINIC_OR_DEPARTMENT_OTHER): Payer: Self-pay

## 2022-08-30 ENCOUNTER — Other Ambulatory Visit: Payer: Self-pay

## 2022-08-30 DIAGNOSIS — Z20822 Contact with and (suspected) exposure to covid-19: Secondary | ICD-10-CM | POA: Insufficient documentation

## 2022-08-30 DIAGNOSIS — J019 Acute sinusitis, unspecified: Secondary | ICD-10-CM | POA: Insufficient documentation

## 2022-08-30 LAB — RESP PANEL BY RT-PCR (FLU A&B, COVID) ARPGX2
Influenza A by PCR: NEGATIVE
Influenza B by PCR: NEGATIVE
SARS Coronavirus 2 by RT PCR: NEGATIVE

## 2022-08-30 MED ORDER — DEXAMETHASONE 4 MG PO TABS
10.0000 mg | ORAL_TABLET | Freq: Once | ORAL | Status: AC
  Administered 2022-08-30: 10 mg via ORAL
  Filled 2022-08-30: qty 3

## 2022-08-30 MED ORDER — AMOXICILLIN-POT CLAVULANATE 875-125 MG PO TABS: 1.0000 | ORAL_TABLET | Freq: Two times a day (BID) | ORAL | 0 refills | Status: DC

## 2022-08-30 MED ORDER — AMOXICILLIN-POT CLAVULANATE 875-125 MG PO TABS
1.0000 | ORAL_TABLET | Freq: Once | ORAL | Status: AC
  Administered 2022-08-30: 1 via ORAL
  Filled 2022-08-30: qty 1

## 2022-08-30 NOTE — ED Provider Notes (Addendum)
[facility] EMERGENCY DEPARTMENT Provider Note   CSN: [id number] Arrival date & time: 08/30/22  1701     History  Chief Complaint  Patient presents with   covid test    Julian Myers is a 56 y.o. male.  Patient here with nasal congestion, body aches, headaches.  Had COVID at a long ago but feels similar.  He works around a lot of dust and thinks it could be allergies or sinus infection.  Symptoms have been ongoing for a week.  Nothing makes it better or worse.  Denies any fevers.  No trauma.  No chest pain or shortness of breath  The history is provided by the patient.       Home Medications Prior to Admission medications   Medication Sig Start Date End Date Taking? Authorizing Provider  amoxicillin-clavulanate (AUGMENTIN) 875-125 MG tablet Take 1 tablet by mouth every 12 (twelve) hours. 08/30/22  Yes Trinisha Paget, DO  doxycycline (VIBRAMYCIN) 100 MG capsule Take 1 capsule (100 mg total) by mouth 2 (two) times daily. 04/11/21   Benjiman Core, MD  fluticasone Aleda Grana) 50 MCG/ACT nasal spray 1 or 2 sprays each nostril twice a day 08/15/19   Lajean Manes, MD  HYDROcodone-acetaminophen (NORCO/VICODIN) 5-325 MG tablet Take 1-2 tablets by mouth every 6 (six) hours as needed for severe pain. Take with food. 08/15/19   Lajean Manes, MD  losartan (COZAAR) 50 MG tablet Take 0.5 tablets (25 mg total) by mouth daily. 07/14/19 07/13/20  Sunnie Nielsen, DO  ondansetron (ZOFRAN ODT) 4 MG disintegrating tablet Take 1 tablet (4 mg total) by mouth every 8 (eight) hours as needed for nausea or vomiting. 10/08/20   Maxwell Caul, PA-C  oxyCODONE-acetaminophen (PERCOCET/ROXICET) 5-325 MG tablet Take 1-2 tablets by mouth every 6 (six) hours as needed for severe pain. 10/08/20   Maxwell Caul, PA-C  predniSONE (STERAPRED UNI-PAK 21 TAB) 10 MG (21) TBPK tablet Take tapering dosage over 6 days as directed 08/15/19   Lajean Manes, MD  SUMAtriptan (IMITREX) 50 MG tablet Take 1 tablet (50  mg total) by mouth once as needed for up to 1 dose for migraine. May repeat in 2 hours if headache persists or recurs. 07/14/19   Sunnie Nielsen, DO      Allergies    Patient has no known allergies.    Review of Systems   Review of Systems  Physical Exam Updated Vital Signs BP (!) 153/98 (BP Location: Right Arm)   Pulse (!) 106   Temp 98.3 F (36.8 C) (Oral)   Resp 18   Ht 6\' 1"  (1.854 m)   Wt 102.1 kg   SpO2 96%   BMI 29.69 kg/m  Physical Exam Vitals and nursing note reviewed.  Constitutional:      General: He is not in acute distress.    Appearance: He is well-developed. He is not ill-appearing.  HENT:     Head: Normocephalic and atraumatic.     Nose: Congestion present.     Mouth/Throat:     Mouth: Mucous membranes are moist.  Eyes:     Extraocular Movements: Extraocular movements intact.     Conjunctiva/sclera: Conjunctivae normal.     Pupils: Pupils are equal, round, and reactive to light.  Cardiovascular:     Rate and Rhythm: Normal rate and regular rhythm.     Pulses: Normal pulses.     Heart sounds: Normal heart sounds. No murmur heard. Pulmonary:     Effort: Pulmonary effort is normal. No respiratory  distress.     Breath sounds: Normal breath sounds.  Abdominal:     Palpations: Abdomen is soft.     Tenderness: There is no abdominal tenderness.  Musculoskeletal:        General: No swelling.     Cervical back: Normal range of motion and neck supple.  Skin:    General: Skin is warm and dry.     Capillary Refill: Capillary refill takes less than 2 seconds.  Neurological:     General: No focal deficit present.     Mental Status: He is alert and oriented to person, place, and time.     Cranial Nerves: No cranial nerve deficit.     Sensory: No sensory deficit.     Motor: No weakness.     Coordination: Coordination normal.  Psychiatric:        Mood and Affect: Mood normal.     ED Results / Procedures / Treatments   Labs (all labs ordered are  listed, but only abnormal results are displayed) Labs Reviewed  RESP PANEL BY RT-PCR (FLU A&B, COVID) [id number]    EKG None  Radiology No results found.  Procedures Procedures    Medications Ordered in ED Medications  dexamethasone (DECADRON) tablet 10 mg (has no administration in time range)  amoxicillin-clavulanate (AUGMENTIN) 875-125 MG per tablet 1 tablet (has no administration in time range)    ED Course/ Medical Decision Making/ A&P                           Medical Decision Making Risk Prescription drug management.   Domonik Levario is here with nasal congestion.  Normal vitals.  No fever.  COVID and flu test are negative.  He is very well-appearing.  Normal neurological exam.  Differential diagnosis is likely sinusitis/viral process.  Will treat with Decadron, Augmentin.  I have no concern for acute intracranial process or pulmonary process or cardiac process.  No signs of throat infection on exam.  Will treat with Decadron and antibiotics and follow-up with his primary care doctor.  Understands return precautions.  Discharged in good condition.  This chart was dictated using voice recognition software.  Despite best efforts to proofread,  errors can occur which can change the documentation meaning.         Final Clinical Impression(s) / ED Diagnoses Final diagnoses:  Acute sinusitis, recurrence not specified, unspecified location    Rx / DC Orders ED Discharge Orders          Ordered    amoxicillin-clavulanate (AUGMENTIN) 875-125 MG tablet  Every 12 hours        08/30/22 1936              Shaasia Odle, DO 08/30/22 1937    Lennice Sites, DO 08/30/22 1937

## 2022-08-30 NOTE — ED Triage Notes (Signed)
Pt to ED requesting COVID test, reports known COVID exposure. Reports feels as he did when he previously had COVID.

## 2023-01-17 ENCOUNTER — Other Ambulatory Visit: Payer: Self-pay

## 2023-01-17 ENCOUNTER — Encounter (HOSPITAL_BASED_OUTPATIENT_CLINIC_OR_DEPARTMENT_OTHER): Payer: Self-pay | Admitting: Emergency Medicine

## 2023-01-17 ENCOUNTER — Emergency Department (HOSPITAL_BASED_OUTPATIENT_CLINIC_OR_DEPARTMENT_OTHER)
Admission: EM | Admit: 2023-01-17 | Discharge: 2023-01-17 | Disposition: A | Discharge status: Home / Self Care | Payer: Self-pay | Attending: Emergency Medicine | Admitting: Emergency Medicine

## 2023-01-17 DIAGNOSIS — R944 Abnormal results of kidney function studies: Secondary | ICD-10-CM | POA: Insufficient documentation

## 2023-01-17 DIAGNOSIS — I10 Essential (primary) hypertension: Secondary | ICD-10-CM | POA: Insufficient documentation

## 2023-01-17 DIAGNOSIS — Z79899 Other long term (current) drug therapy: Secondary | ICD-10-CM | POA: Insufficient documentation

## 2023-01-17 DIAGNOSIS — K2950 Unspecified chronic gastritis without bleeding: Secondary | ICD-10-CM | POA: Insufficient documentation

## 2023-01-17 LAB — CBC
HCT: 47.5 % (ref 39.0–52.0)
Hemoglobin: 16.6 g/dL (ref 13.0–17.0)
MCH: 30.9 pg (ref 26.0–34.0)
MCHC: 34.9 g/dL (ref 30.0–36.0)
MCV: 88.5 fL (ref 80.0–100.0)
Platelets: 196 10*3/uL (ref 150–400)
RBC: 5.37 MIL/uL (ref 4.22–5.81)
RDW: 12.2 % (ref 11.5–15.5)
WBC: 5.6 10*3/uL (ref 4.0–10.5)
nRBC: 0 % (ref 0.0–0.2)

## 2023-01-17 LAB — COMPREHENSIVE METABOLIC PANEL
ALT: 29 U/L (ref 0–44)
AST: 23 U/L (ref 15–41)
Albumin: 3.6 g/dL (ref 3.5–5.0)
Alkaline Phosphatase: 57 U/L (ref 38–126)
Anion gap: 7 (ref 5–15)
BUN: 24 mg/dL — ABNORMAL HIGH (ref 6–20)
CO2: 24 mmol/L (ref 22–32)
Calcium: 9 mg/dL (ref 8.9–10.3)
Chloride: 106 mmol/L (ref 98–111)
Creatinine, Ser: 1.38 mg/dL — ABNORMAL HIGH (ref 0.61–1.24)
GFR, Estimated: >60 mL/min (ref >60)
Glucose, Bld: 116 mg/dL — ABNORMAL HIGH (ref 70–99)
Potassium: 3.9 mmol/L (ref 3.5–5.1)
Sodium: 137 mmol/L (ref 135–145)
Total Bilirubin: 0.3 mg/dL (ref 0.3–1.2)
Total Protein: 6.9 g/dL (ref 6.5–8.1)

## 2023-01-17 LAB — LIPASE, BLOOD: Lipase: 48 U/L (ref 11–51)

## 2023-01-17 MED ORDER — PANTOPRAZOLE SODIUM 40 MG IV SOLR
40.0000 mg | Freq: Once | INTRAVENOUS | Status: AC
  Administered 2023-01-17: 40 mg via INTRAVENOUS
  Filled 2023-01-17: qty 10

## 2023-01-17 MED ORDER — PANTOPRAZOLE SODIUM 40 MG PO TBEC: 40.0000 mg | DELAYED_RELEASE_TABLET | Freq: Every day | ORAL | 2 refills | Status: DC

## 2023-01-17 NOTE — ED Provider Notes (Signed)
[place] EMERGENCY DEPARTMENT AT [facility]  Provider Note  CSN: [id number] Arrival date & time: 01/17/23 0501  History Chief Complaint  Patient presents with   Abdominal Pain    Julian Myers is a 57 y.o. male with history of HTN and anxiety has had many months of persistent, daily epigastric pain, sometimes worse with eating but not always. He reports difficulty swallowing solid foods at times, but never had an esophageal obstruction. He saw his PCP for same about 6 months ago and was told to 'take antacids'. He has tried numerous OTC meds without improvement. He denies any vomiting, no diarrhea. No melena. No fevers. He woke up during the night with worsened pain prompting his ED visit. He drinks EtOH daily and takes Aleve frequently (about every other day).    Home Medications Prior to Admission medications   Medication Sig Start Date End Date Taking? Authorizing Provider  pantoprazole (PROTONIX) 40 MG tablet Take 1 tablet (40 mg total) by mouth daily. 01/17/23 04/17/23 Yes Truddie Hidden, MD  fluticasone Asencion Islam) 50 MCG/ACT nasal spray 1 or 2 sprays each nostril twice a day 08/15/19   Jacqulyn Cane, MD  losartan (COZAAR) 50 MG tablet Take 0.5 tablets (25 mg total) by mouth daily. 07/14/19 07/13/20  Emeterio Reeve, DO  SUMAtriptan (IMITREX) 50 MG tablet Take 1 tablet (50 mg total) by mouth once as needed for up to 1 dose for migraine. May repeat in 2 hours if headache persists or recurs. 07/14/19   Emeterio Reeve, DO     Allergies    Patient has no known allergies.   Review of Systems   Review of Systems Please see HPI for pertinent positives and negatives  Physical Exam BP (!) 160/110 (BP Location: Right Arm)   Pulse 75   Temp 97.7 F (36.5 C) (Oral)   Resp 18   Ht 6' 1"$  (1.854 m)   Wt 99.8 kg   SpO2 95%   BMI 29.03 kg/m   Physical Exam Vitals and nursing note reviewed.  Constitutional:      Appearance: Normal appearance.  HENT:     Head:  Normocephalic and atraumatic.     Nose: Nose normal.     Mouth/Throat:     Mouth: Mucous membranes are moist.  Eyes:     Extraocular Movements: Extraocular movements intact.     Conjunctiva/sclera: Conjunctivae normal.  Cardiovascular:     Rate and Rhythm: Normal rate.  Pulmonary:     Effort: Pulmonary effort is normal.     Breath sounds: Normal breath sounds.  Abdominal:     General: Abdomen is flat.     Palpations: Abdomen is soft. There is no hepatomegaly.     Tenderness: There is abdominal tenderness in the epigastric area. There is no guarding. Negative signs include Murphy's sign and McBurney's sign.  Musculoskeletal:        General: No swelling. Normal range of motion.     Cervical back: Neck supple.  Skin:    General: Skin is warm and dry.  Neurological:     General: No focal deficit present.     Mental Status: He is alert.  Psychiatric:        Mood and Affect: Mood normal.     ED Results / Procedures / Treatments   EKG EKG Interpretation  Date/Time:  Thursday January 17 2023 05:32:29 EST Ventricular Rate:  71 PR Interval:  147 QRS Duration: 91 QT Interval:  386 QTC Calculation: 420 R  Axis:   0 Text Interpretation: Sinus rhythm RSR' in V1 or V2, right VCD or RVH Baseline wander in lead(s) V3 No old tracing to compare Confirmed by Calvert Cantor [phone]) on 01/17/2023 5:33:03 AM  Procedures Procedures  Medications Ordered in the ED Medications  pantoprazole (PROTONIX) injection 40 mg (40 mg Intravenous Given 01/17/23 0546)    Initial Impression and Plan  Patient here with chronic epigastric pain, suspect this is gastritis or PUD. Less likely biliary disease or pancreatitis. Will check labs, begin PPI and reassess.   ED Course   Clinical Course as of 01/17/23 0634  Thu Jan 17, 2023  0610 CBC is normal.  [CS]  [id number] CMP with mildly elevated Cr, improved from previous last year, normal LFTs and lipase.  [CS]  [id number] Patient's ED workup is unremarkable.  Suspect his symptoms are GI related, likely gastritis or GERD. Will plan Rx for Protonix. Advised to avoid EtOH or NSAIDs and follow up with PCP. Referral to GI. RTED for any other concerns.  [CS]    Clinical Course User Index [CS] Truddie Hidden, MD     MDM Rules/Calculators/A&P Medical Decision Making Problems Addressed: Chronic gastritis without bleeding, unspecified gastritis type: acute illness or injury  Amount and/or Complexity of Data Reviewed Labs: ordered. Decision-making details documented in ED Course.  Risk Prescription drug management.     Final Clinical Impression(s) / ED Diagnoses Final diagnoses:  Chronic gastritis without bleeding, unspecified gastritis type    Rx / DC Orders ED Discharge Orders          Ordered    pantoprazole (PROTONIX) 40 MG tablet  Daily        01/17/23 0634             Truddie Hidden, MD 01/17/23 [phone]

## 2023-01-17 NOTE — ED Notes (Signed)
Pt unable to leave urine sample at this time.

## 2023-01-17 NOTE — ED Triage Notes (Signed)
Pt states stomach and esophagus pain for months. States getting hard to breath. Seen by PCP and was told to take antiacids.

## 2023-02-12 ENCOUNTER — Other Ambulatory Visit: Payer: Self-pay

## 2023-02-12 ENCOUNTER — Emergency Department (HOSPITAL_BASED_OUTPATIENT_CLINIC_OR_DEPARTMENT_OTHER): Payer: Self-pay

## 2023-02-12 ENCOUNTER — Encounter (HOSPITAL_BASED_OUTPATIENT_CLINIC_OR_DEPARTMENT_OTHER): Payer: Self-pay | Admitting: Emergency Medicine

## 2023-02-12 ENCOUNTER — Emergency Department (HOSPITAL_BASED_OUTPATIENT_CLINIC_OR_DEPARTMENT_OTHER)
Admission: EM | Admit: 2023-02-12 | Discharge: 2023-02-12 | Disposition: A | Discharge status: Home / Self Care | Payer: Self-pay | Attending: Emergency Medicine | Admitting: Emergency Medicine

## 2023-02-12 DIAGNOSIS — K5792 Diverticulitis of intestine, part unspecified, without perforation or abscess without bleeding: Secondary | ICD-10-CM

## 2023-02-12 DIAGNOSIS — K5732 Diverticulitis of large intestine without perforation or abscess without bleeding: Secondary | ICD-10-CM | POA: Insufficient documentation

## 2023-02-12 DIAGNOSIS — R1084 Generalized abdominal pain: Secondary | ICD-10-CM

## 2023-02-12 LAB — CBC
HCT: 50.6 % (ref 39.0–52.0)
Hemoglobin: 18.4 g/dL — ABNORMAL HIGH (ref 13.0–17.0)
MCH: 31.5 pg (ref 26.0–34.0)
MCHC: 36.4 g/dL — ABNORMAL HIGH (ref 30.0–36.0)
MCV: 86.5 fL (ref 80.0–100.0)
Platelets: 217 10*3/uL (ref 150–400)
RBC: 5.85 MIL/uL — ABNORMAL HIGH (ref 4.22–5.81)
RDW: 11.9 % (ref 11.5–15.5)
WBC: 7.9 10*3/uL (ref 4.0–10.5)
nRBC: 0 % (ref 0.0–0.2)

## 2023-02-12 LAB — COMPREHENSIVE METABOLIC PANEL
ALT: 50 U/L — ABNORMAL HIGH (ref 0–44)
AST: 43 U/L — ABNORMAL HIGH (ref 15–41)
Albumin: 4.1 g/dL (ref 3.5–5.0)
Alkaline Phosphatase: 65 U/L (ref 38–126)
Anion gap: 10 (ref 5–15)
BUN: 18 mg/dL (ref 6–20)
CO2: 24 mmol/L (ref 22–32)
Calcium: 9.6 mg/dL (ref 8.9–10.3)
Chloride: 101 mmol/L (ref 98–111)
Creatinine, Ser: 1.42 mg/dL — ABNORMAL HIGH (ref 0.61–1.24)
GFR, Estimated: 58 mL/min — ABNORMAL LOW (ref >60)
Glucose, Bld: 129 mg/dL — ABNORMAL HIGH (ref 70–99)
Potassium: 3.7 mmol/L (ref 3.5–5.1)
Sodium: 135 mmol/L (ref 135–145)
Total Bilirubin: 1.2 mg/dL (ref 0.3–1.2)
Total Protein: 7.8 g/dL (ref 6.5–8.1)

## 2023-02-12 LAB — LIPASE, BLOOD: Lipase: 52 U/L — ABNORMAL HIGH (ref 11–51)

## 2023-02-12 MED ORDER — HYDROMORPHONE HCL 1 MG/ML IJ SOLN
1.0000 mg | Freq: Once | INTRAMUSCULAR | Status: AC
  Administered 2023-02-12: 1 mg via INTRAVENOUS
  Filled 2023-02-12: qty 1

## 2023-02-12 MED ORDER — HYDROCODONE-ACETAMINOPHEN 5-325 MG PO TABS: 1.0000 | ORAL_TABLET | ORAL | 0 refills | Status: AC | PRN

## 2023-02-12 MED ORDER — FAMOTIDINE IN NACL 20-0.9 MG/50ML-% IV SOLN
20.0000 mg | Freq: Once | INTRAVENOUS | Status: AC
  Administered 2023-02-12: 20 mg via INTRAVENOUS
  Filled 2023-02-12: qty 50

## 2023-02-12 MED ORDER — ONDANSETRON HCL 4 MG/2ML IJ SOLN
4.0000 mg | Freq: Once | INTRAMUSCULAR | Status: AC
  Administered 2023-02-12: 4 mg via INTRAVENOUS

## 2023-02-12 MED ORDER — ONDANSETRON HCL 4 MG/2ML IJ SOLN: 4.0000 mg | Freq: Once | INTRAMUSCULAR | Status: DC

## 2023-02-12 MED ORDER — AMOXICILLIN-POT CLAVULANATE 875-125 MG PO TABS: 1.0000 | ORAL_TABLET | Freq: Two times a day (BID) | ORAL | 0 refills | Status: DC

## 2023-02-12 MED ORDER — ONDANSETRON 4 MG PO TBDP
4.0000 mg | ORAL_TABLET | Freq: Once | ORAL | Status: DC
  Filled 2023-02-12: qty 1

## 2023-02-12 MED ORDER — SODIUM CHLORIDE 0.9 % IV BOLUS
1000.0000 mL | Freq: Once | INTRAVENOUS | Status: AC
  Administered 2023-02-12: 1000 mL via INTRAVENOUS

## 2023-02-12 MED ORDER — IOHEXOL 300 MG/ML  SOLN
100.0000 mL | Freq: Once | INTRAMUSCULAR | Status: AC | PRN
  Administered 2023-02-12: 100 mL via INTRAVENOUS

## 2023-02-12 MED ORDER — ONDANSETRON HCL 4 MG/2ML IJ SOLN
INTRAMUSCULAR | Status: AC
  Filled 2023-02-12: qty 2

## 2023-02-12 MED ORDER — ONDANSETRON HCL 4 MG/2ML IJ SOLN
4.0000 mg | Freq: Once | INTRAMUSCULAR | Status: AC
  Administered 2023-02-12: 4 mg via INTRAVENOUS
  Filled 2023-02-12: qty 2

## 2023-02-12 NOTE — ED Provider Notes (Signed)
[place] EMERGENCY DEPARTMENT AT [facility] Provider Note   CSN: [id number] Arrival date & time: 02/12/23  0957     History  Chief Complaint  Patient presents with   Abdominal Pain    Julian Myers is a 57 y.o. male.  Patient complains of abdominal pain.  Reports he has been having similar symptoms for over a month.  Patient complains of abdominal bloating and discomfort.  Patient denies any fever or chills patient denies any black or tarry looking stools.  Was seen here in February and was prescribed Protonix.  The history is provided by the patient. No language interpreter was used.  Abdominal Pain Pain location:  Generalized Pain quality: aching   Pain radiates to:  Does not radiate Pain severity:  Moderate Onset quality:  Gradual Duration:  4 weeks Timing:  Constant Progression:  Worsening Chronicity:  New Context: alcohol use and eating   Relieved by:  Nothing Worsened by:  Nothing Ineffective treatments:  None tried Associated symptoms: no anorexia        Home Medications Prior to Admission medications   Medication Sig Start Date End Date Taking? Authorizing Provider  amoxicillin-clavulanate (AUGMENTIN) 875-125 MG tablet Take 1 tablet by mouth 2 (two) times daily. 02/12/23  Yes Fransico Meadow, PA-C  HYDROcodone-acetaminophen (NORCO/VICODIN) 5-325 MG tablet Take 1 tablet by mouth every 4 (four) hours as needed for moderate pain. 02/12/23 02/12/24 Yes Fransico Meadow, PA-C  fluticasone [facility]) 50 MCG/ACT nasal spray 1 or 2 sprays each nostril twice a day 08/15/19   Jacqulyn Cane, MD  losartan (COZAAR) 50 MG tablet Take 0.5 tablets (25 mg total) by mouth daily. 07/14/19 07/13/20  Emeterio Reeve, DO  pantoprazole (PROTONIX) 40 MG tablet Take 1 tablet (40 mg total) by mouth daily. 01/17/23 04/17/23  Truddie Hidden, MD  SUMAtriptan (IMITREX) 50 MG tablet Take 1 tablet (50 mg total) by mouth once as needed for up to 1 dose for migraine. May repeat in 2  hours if headache persists or recurs. 07/14/19   Emeterio Reeve, DO      Allergies    Patient has no known allergies.    Review of Systems   Review of Systems  Gastrointestinal:  Positive for abdominal pain. Negative for anorexia.  All other systems reviewed and are negative.   Physical Exam Updated Vital Signs BP (!) 162/98 (BP Location: Left Arm)   Pulse 98   Temp (!) 97.5 F (36.4 C) (Oral)   Resp 17   Wt 100.7 kg   SpO2 96%   BMI 29.29 kg/m  Physical Exam Vitals and nursing note reviewed.  Constitutional:      Appearance: He is well-developed.  HENT:     Head: Normocephalic.  Cardiovascular:     Rate and Rhythm: Normal rate and regular rhythm.  Pulmonary:     Effort: Pulmonary effort is normal.  Abdominal:     General: Abdomen is flat. There is no distension.     Palpations: Abdomen is soft.     Tenderness: There is abdominal tenderness.  Musculoskeletal:        General: Normal range of motion.     Cervical back: Normal range of motion.  Neurological:     General: No focal deficit present.     Mental Status: He is alert and oriented to person, place, and time.  Psychiatric:        Mood and Affect: Mood normal.     ED Results / Procedures / Treatments  Labs (all labs ordered are listed, but only abnormal results are displayed) Labs Reviewed  LIPASE, BLOOD - Abnormal; Notable for the following components:      Result Value   Lipase 52 (*)    All other components within normal limits  COMPREHENSIVE METABOLIC PANEL - Abnormal; Notable for the following components:   Glucose, Bld 129 (*)    Creatinine, Ser 1.42 (*)    AST 43 (*)    ALT 50 (*)    GFR, Estimated 58 (*)    All other components within normal limits  CBC - Abnormal; Notable for the following components:   RBC 5.85 (*)    Hemoglobin 18.4 (*)    MCHC 36.4 (*)    All other components within normal limits  URINALYSIS, ROUTINE W REFLEX MICROSCOPIC    EKG EKG  Interpretation  Date/Time:  Tuesday February 12 2023 10:07:45 EDT Ventricular Rate:  96 PR Interval:  142 QRS Duration: 78 QT Interval:  340 QTC Calculation: 430 R Axis:   5 Text Interpretation: Sinus rhythm No significant change since last tracing Confirmed by Regan Lemming ([id number]) on 02/12/2023 12:07:44 PM  Radiology CT ABDOMEN PELVIS W CONTRAST  Result Date: 02/12/2023 CLINICAL DATA:  Abdominal pain. Nausea and vomiting. Several months duration. EXAM: CT ABDOMEN AND PELVIS WITH CONTRAST TECHNIQUE: Multidetector CT imaging of the abdomen and pelvis was performed using the standard protocol following bolus administration of intravenous contrast. RADIATION DOSE REDUCTION: This exam was performed according to the departmental dose-optimization program which includes automated exposure control, adjustment of the mA and/or kV according to patient size and/or use of iterative reconstruction technique. CONTRAST:  124m OMNIPAQUE IOHEXOL 300 MG/ML  SOLN COMPARISON:  None Available. FINDINGS: Lower chest: Lung bases are clear. No pleural or pericardial fluid. Small hiatal hernia. Hepatobiliary: Mild to moderate diffuse fatty change of the liver. No focal liver lesion. No calcified gallstones. Pancreas: Normal Spleen: Normal Adrenals/Urinary Tract: Adrenal glands are normal. No renal stone, mass or obstruction. Chronic scarring in the lower poles. Bladder is normal. Stomach/Bowel: Stomach and small intestine are normal. Normal appendix. There is diverticulosis of the sigmoid colon. There is minimal fat stranding in the adjacent mesentery. This could represent evidence of minimal diverticulitis or could be chronic scarring. No advanced finding. Vascular/Lymphatic: Aorta and IVC are normal.  No adenopathy. Reproductive: Normal Other: No free fluid or air. Musculoskeletal: Previous lumbar fusion surgery L4 to sacrum. 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 Possible hardware discontinuity posteriorly. IMPRESSION: 1. Mild  to moderate diffuse fatty change of the liver. This could be symptomatic. 2. Diverticulosis of the sigmoid colon. Minimal fat stranding in the adjacent mesentery. This could represent evidence of minimal acute diverticulitis, could be resolving diverticulitis or could be chronic scarring. No advanced finding. 3. Previous lumbar fusion surgery L4 to sacrum. 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 Electronically Signed   By: MNelson ChimesM.D.   On: 02/12/2023 12:53    Procedures Procedures    Medications Ordered in ED Medications  HYDROmorphone (DILAUDID) injection 1 mg (1 mg Intravenous Given 02/12/23 1224)  ondansetron (ZOFRAN) injection 4 mg (4 mg Intravenous Given 02/12/23 1224)  sodium chloride 0.9 % bolus 1,000 mL (0 mLs Intravenous Stopped 02/12/23 1351)  iohexol (OMNIPAQUE) 300 MG/ML solution 100 mL (100 mLs Intravenous Contrast Given 02/12/23 1232)  famotidine (PEPCID) IVPB 20 mg premix (0 mg Intravenous Stopped 02/12/23 1417)  HYDROmorphone (DILAUDID) injection 1 mg (1 mg Intravenous Given 02/12/23 1438)  ondansetron (ZOFRAN) injection 4 mg (  Intravenous Canceled Entry 02/12/23 1557)    ED Course/ Medical Decision Making/ A&P                             Medical Decision Making Complains of abdominal pain that has progressively gotten worse over the last 4 weeks.  Amount and/or Complexity of Data Reviewed External Data Reviewed: notes.    Details: Urgency department notes reviewed Labs: ordered.    Details: Labs ordered reviewed and interpreted patient has a slightly elevated lipase of 57 Radiology: ordered.    Details: CT abdomen shows reticulosis with fat stranding which could represent early diverticulitis  Risk Prescription drug management. Risk Details: Has scheduled an appointment to follow-up with GI patient is given a prescription for Augmentin, hydrocodone and Zofran for nausea.  Patient is advised to return to the emergency department if symptoms worsen or  change           Final Clinical Impression(s) / ED Diagnoses Final diagnoses:  Generalized abdominal pain  Diverticulitis    Rx / DC Orders ED Discharge Orders          Ordered    amoxicillin-clavulanate (AUGMENTIN) 875-125 MG tablet  2 times daily        02/12/23 1529    HYDROcodone-acetaminophen (NORCO/VICODIN) 5-325 MG tablet  Every 4 hours PRN        02/12/23 1529           An After Visit Summary was printed and given to the patient.    Fransico Meadow, [place] 02/12/23 1845    Regan Lemming, MD 02/12/23 [phone]

## 2023-02-12 NOTE — ED Triage Notes (Signed)
Pt arrives pov, to triage in wheelchair, c/o of abd pain, recently seen for same. Endorses n/v, states "Im swollen", reports meds not improving pain

## 2023-02-12 NOTE — ED Notes (Signed)
Pt in bed, pt states that he is still having some nausea, will continue to monitor.

## 2023-02-12 NOTE — ED Notes (Signed)
Pt c/o nausea and being hot. IV zofran given, cool wet cloth provided. Holding for discharge at this time until symptoms improve.

## 2023-02-12 NOTE — ED Notes (Signed)
Patient unable to urinate at this time. 

## 2023-02-12 NOTE — Discharge Instructions (Signed)
Follow up with Gi for evaluation

## 2024-11-25 ENCOUNTER — Emergency Department (HOSPITAL_BASED_OUTPATIENT_CLINIC_OR_DEPARTMENT_OTHER): Payer: Self-pay

## 2024-11-25 ENCOUNTER — Encounter (HOSPITAL_BASED_OUTPATIENT_CLINIC_OR_DEPARTMENT_OTHER): Payer: Self-pay

## 2024-11-25 ENCOUNTER — Other Ambulatory Visit: Payer: Self-pay

## 2024-11-25 ENCOUNTER — Emergency Department (HOSPITAL_BASED_OUTPATIENT_CLINIC_OR_DEPARTMENT_OTHER)
Admission: EM | Admit: 2024-11-25 | Discharge: 2024-11-25 | Disposition: A | Discharge status: Home / Self Care | Payer: Self-pay | Attending: Emergency Medicine | Admitting: Emergency Medicine

## 2024-11-25 DIAGNOSIS — R202 Paresthesia of skin: Secondary | ICD-10-CM | POA: Insufficient documentation

## 2024-11-25 DIAGNOSIS — R791 Abnormal coagulation profile: Secondary | ICD-10-CM | POA: Insufficient documentation

## 2024-11-25 DIAGNOSIS — R0789 Other chest pain: Secondary | ICD-10-CM | POA: Insufficient documentation

## 2024-11-25 DIAGNOSIS — R079 Chest pain, unspecified: Secondary | ICD-10-CM

## 2024-11-25 DIAGNOSIS — R519 Headache, unspecified: Secondary | ICD-10-CM | POA: Insufficient documentation

## 2024-11-25 DIAGNOSIS — R0602 Shortness of breath: Secondary | ICD-10-CM | POA: Insufficient documentation

## 2024-11-25 LAB — CBC WITH DIFFERENTIAL/PLATELET
Abs Immature Granulocytes: 0.01 K/uL (ref 0.00–0.07)
Basophils Absolute: 0 K/uL (ref 0.0–0.1)
Basophils Relative: 1 %
Eosinophils Absolute: 0.3 K/uL (ref 0.0–0.5)
Eosinophils Relative: 4 %
HCT: 42.5 % (ref 39.0–52.0)
Hemoglobin: 15 g/dL (ref 13.0–17.0)
Immature Granulocytes: 0 %
Lymphocytes Relative: 38 %
Lymphs Abs: 2.3 K/uL (ref 0.7–4.0)
MCH: 30.9 pg (ref 26.0–34.0)
MCHC: 35.3 g/dL (ref 30.0–36.0)
MCV: 87.4 fL (ref 80.0–100.0)
Monocytes Absolute: 0.4 K/uL (ref 0.1–1.0)
Monocytes Relative: 7 %
Neutro Abs: 3 K/uL (ref 1.7–7.7)
Neutrophils Relative %: 50 %
Platelets: 161 K/uL (ref 150–400)
RBC: 4.86 MIL/uL (ref 4.22–5.81)
RDW: 12.6 % (ref 11.5–15.5)
WBC: 6 K/uL (ref 4.0–10.5)
nRBC: 0 % (ref 0.0–0.2)

## 2024-11-25 LAB — COMPREHENSIVE METABOLIC PANEL WITH GFR
ALT: 31 U/L (ref 0–44)
AST: 23 U/L (ref 15–41)
Albumin: 4 g/dL (ref 3.5–5.0)
Alkaline Phosphatase: 73 U/L (ref 38–126)
Anion gap: 12 (ref 5–15)
BUN: 21 mg/dL — ABNORMAL HIGH (ref 6–20)
CO2: 24 mmol/L (ref 22–32)
Calcium: 9.4 mg/dL (ref 8.9–10.3)
Chloride: 104 mmol/L (ref 98–111)
Creatinine, Ser: 1.56 mg/dL — ABNORMAL HIGH (ref 0.61–1.24)
GFR, Estimated: 51 mL/min — ABNORMAL LOW
Glucose, Bld: 142 mg/dL — ABNORMAL HIGH (ref 70–99)
Potassium: 4.1 mmol/L (ref 3.5–5.1)
Sodium: 140 mmol/L (ref 135–145)
Total Bilirubin: 0.4 mg/dL (ref 0.0–1.2)
Total Protein: 6.3 g/dL — ABNORMAL LOW (ref 6.5–8.1)

## 2024-11-25 LAB — RESP PANEL BY RT-PCR (RSV, FLU A&B, COVID)  RVPGX2
Influenza A by PCR: NEGATIVE
Influenza B by PCR: NEGATIVE
Resp Syncytial Virus by PCR: NEGATIVE
SARS Coronavirus 2 by RT PCR: NEGATIVE

## 2024-11-25 LAB — D-DIMER, QUANTITATIVE: D-Dimer, Quant: 0.64 ug{FEU}/mL — ABNORMAL HIGH (ref 0.00–0.50)

## 2024-11-25 LAB — TROPONIN T, HIGH SENSITIVITY
Troponin T High Sensitivity: 58 ng/L — ABNORMAL HIGH (ref 0–19)
Troponin T High Sensitivity: 56 ng/L — ABNORMAL HIGH (ref 0–19)

## 2024-11-25 MED ORDER — IOHEXOL 350 MG/ML SOLN
100.0000 mL | Freq: Once | INTRAVENOUS | Status: AC | PRN
  Administered 2024-11-25: 100 mL via INTRAVENOUS

## 2024-11-25 NOTE — ED Provider Notes (Signed)
 " [place] [facility] Provider Note   CSN: [id number] Arrival date & time: 11/25/24  9256     Patient presents with: Chest Pain   Julian Myers is a 58 y.o. male.   Patient here with left-sided chest discomfort, tingling down his left arm intermittently.  Maybe noticed it about a week ago.  He has been a little short of breath.  He has been a little bit sick with cough as well over the last 3 weeks but no fever chills or bodyaches..  Denies any weakness.  Headache.  No neck pain.  Seems a little bit worse when he moves his left shoulder but he feels like exertion makes it worse..  Denies any trauma.  Does have history of hypertension and anxiety.  He does smoke cigars.  No abdominal pain nausea vomiting diarrhea.  Denies any cocaine use.  Also has chronic kidney disease.  The history is provided by the patient.       Prior to Admission medications  Medication Sig Start Date End Date Taking? Authorizing Provider  amoxicillin -clavulanate (AUGMENTIN ) 875-125 MG tablet Take 1 tablet by mouth 2 (two) times daily. 02/12/23   Sofia, Leslie K, PA-C  fluticasone  (FLONASE ) 50 MCG/ACT nasal spray 1 or 2 sprays each nostril twice a day 08/15/19   Jaycee Lenis, MD  losartan  (COZAAR ) 50 MG tablet Take 0.5 tablets (25 mg total) by mouth daily. 07/14/19 07/13/20  Alexander, Natalie, DO  pantoprazole  (PROTONIX ) 40 MG tablet Take 1 tablet (40 mg total) by mouth daily. 01/17/23 04/17/23  Roselyn Carlin NOVAK, MD  SUMAtriptan  (IMITREX ) 50 MG tablet Take 1 tablet (50 mg total) by mouth once as needed for up to 1 dose for migraine. May repeat in 2 hours if headache persists or recurs. 07/14/19   Alexander, Natalie, DO    Allergies: Patient has no known allergies.    Review of Systems  Updated Vital Signs BP (!) 161/93   Pulse 68   Temp 98 F (36.7 C) (Oral)   Resp 17   Ht 6' 1 (1.854 m)   Wt 104.3 kg   SpO2 97%   BMI 30.34 kg/m   Physical Exam Vitals and nursing  note reviewed.  Constitutional:      General: He is not in acute distress.    Appearance: He is well-developed. He is not ill-appearing.  HENT:     Head: Normocephalic and atraumatic.  Eyes:     Extraocular Movements: Extraocular movements intact.     Conjunctiva/sclera: Conjunctivae normal.     Pupils: Pupils are equal, round, and reactive to light.  Cardiovascular:     Rate and Rhythm: Normal rate and regular rhythm.     Heart sounds: Normal heart sounds. No murmur heard. Pulmonary:     Effort: Pulmonary effort is normal. No respiratory distress.     Breath sounds: Normal breath sounds.  Chest:     Chest wall: No tenderness.  Abdominal:     Palpations: Abdomen is soft.     Tenderness: There is no abdominal tenderness.  Musculoskeletal:        General: No swelling. Normal range of motion.     Cervical back: Normal range of motion and neck supple.     Right lower leg: No edema.     Left lower leg: No edema.     Comments: No midline spinal tenderness, some tenderness to the left trapezius area, pain with range of motion of the left shoulder  Skin:    General: Skin is warm and dry.     Capillary Refill: Capillary refill takes less than 2 seconds.  Neurological:     General: No focal deficit present.     Mental Status: He is alert.     Cranial Nerves: No cranial nerve deficit.     Motor: No weakness.     Comments: Normal strength and sensation, 5+ out of 5 strength throughout the upper extremities and lower extremities, normal sensation throughout normal speech normal visual fields  Psychiatric:        Mood and Affect: Mood normal.     (all labs ordered are listed, but only abnormal results are displayed) Labs Reviewed  COMPREHENSIVE METABOLIC PANEL WITH GFR - Abnormal; Notable for the following components:      Result Value   Glucose, Bld 142 (*)    BUN 21 (*)    Creatinine, Ser 1.56 (*)    Total Protein 6.3 (*)    GFR, Estimated 51 (*)    All other components within  normal limits  D-DIMER, QUANTITATIVE - Abnormal; Notable for the following components:   D-Dimer, Quant 0.64 (*)    All other components within normal limits  TROPONIN T, HIGH SENSITIVITY - Abnormal; Notable for the following components:   Troponin T High Sensitivity 58 (*)    All other components within normal limits  TROPONIN T, HIGH SENSITIVITY - Abnormal; Notable for the following components:   Troponin T High Sensitivity 56 (*)    All other components within normal limits  RESP PANEL BY RT-PCR (RSV, FLU A&B, COVID)  [id number]  CBC WITH DIFFERENTIAL/PLATELET    EKG: EKG Interpretation Date/Time:  Wednesday November 25 2024 07:55:37 EST Ventricular Rate:  79 PR Interval:  141 QRS Duration:  88 QT Interval:  405 QTC Calculation: 465 R Axis:   0  Text Interpretation: Sinus rhythm Confirmed by Ruthe Cornet [phone]) on 11/25/2024 7:59:20 AM  Radiology: CT Angio Chest PE W and/or Wo Contrast Result Date: 11/25/2024 EXAM: CTA CHEST 11/25/2024 10:11:36 AM TECHNIQUE: CTA of the chest was performed after the administration of intravenous contrast. Multiplanar reformatted images are provided for review. MIP images are provided for review. Automated exposure control, iterative reconstruction, and/or weight based adjustment of the mA/kV was utilized to reduce the radiation dose to as low as reasonably achievable. COMPARISON: 11/25/2024 radiograph CLINICAL HISTORY: Pulmonary embolism (PE) suspected, high prob. FINDINGS: PULMONARY ARTERIES: Pulmonary arteries are adequately opacified for evaluation. No acute pulmonary embolus. Main pulmonary artery is normal in caliber. MEDIASTINUM: Diffuse multivessel coronary atherosclerosis. Trace pericardial effusion. There is no acute abnormality of the thoracic aorta. LYMPH NODES: No mediastinal, hilar or axillary lymphadenopathy. LUNGS AND PLEURA: The lungs are without acute process. No focal consolidation or pulmonary edema. No evidence of pleural effusion or  pneumothorax. UPPER ABDOMEN: Small hiatal hernia. SOFT TISSUES AND BONES: Small volume symmetric bilateral gynecomastia. Thoracic DISH. No acute bone or soft tissue abnormality. IMPRESSION: 1. No pulmonary embolism. No pneumonia, pulmonary edema, or pleural effusion. Electronically signed by: Rogelia Myers MD 11/25/2024 10:26 AM EST RP Workstation: [id number]   DG Chest Portable 1 View Result Date: 11/25/2024 CLINICAL DATA:  Chest pain EXAM: PORTABLE CHEST 1 VIEW COMPARISON:  Apr 14, 2019 FINDINGS: The cardiomediastinal silhouette is unchanged in contour. No pleural effusion. No pneumothorax. No acute pleuroparenchymal abnormality. IMPRESSION: No acute cardiopulmonary abnormality. Electronically Signed   By: Corean Salter M.D.   On: 11/25/2024 08:28     Procedures   Medications  Ordered in the ED  iohexol  (OMNIPAQUE ) 350 MG/ML injection 100 mL (100 mLs Intravenous Contrast Given 11/25/24 1001)                                    Medical Decision Making Amount and/or Complexity of Data Reviewed Labs: ordered. Radiology: ordered.  Risk Prescription drug management.   Blake Vetrano is here with chest pain paresthesia feeling on the left arm.  Chest pain may be exertional maybe over the last week.  Started again overnight.  Has also had a bad cough for the last 3 weeks as well but denies any fevers or chills.  Unremarkable vitals.  No fever.  History of hypertension.  Differential diagnosis likely [facility] ACS PE, infectious process.  He has had some mild cough will evaluate for pneumonia COVID flu RSV.  No recent surgery or travel.  Does not seem to have any major PE risk factors.   verall we will get basic labs troponin chest x-ray.  EKG shows sinus rhythm.  No ischemic changes.  He is neurovascular neuromuscular intact on exam.  Have no concern for stroke.  His got normal strength and sensation on exam.  Maybe some reproducible pain on exam but not very confident.  Is well-appearing.  No concern  for dissection.  This could be stress related as well.  He is well-appearing.  He has no fever.  Since is likely to be a myocarditis or pericarditis.  Does not really have pain when he takes a deep breath and leans forward.  Troponin 56 and 58.  He does have some chronic kidney disease with creatinine elevated at his baseline.  This is what I suspect this troponin leak.  He is chest pain-free now.  Atypical story.  Heart score is 2.  I got a D-dimer which is elevated I got a PE scan that was unremarkable.  I talked with Dr. Court w cardiology and in agreement patient can follow-up closely outpatient.  We do suspect troponin elevation from his chronic kidney disease but we do not have any prior to compare to.  Patient feels comfortable with this plan.  Understands return precautions.  There is no evidence of pneumonia or other abnormality on his blood work today.  Patient discharged.  My hope is that this is something muscular or stress related.  Continue Tylenol  and ibuprofen.  This chart was dictated using voice recognition software.  Despite best efforts to proofread,  errors can occur which can change the documentation meaning.   This chart was dictated using voice recognition software.  Despite best efforts to proofread,  errors can occur which can change the documentation meaning.      Final diagnoses:  Nonspecific chest pain    ED Discharge Orders          Ordered    Ambulatory referral to Cardiology       Comments: If you have not heard from the Cardiology office within the next 72 hours please call [phone].   11/25/24 1118               Mykai Wendorf, DO 11/25/24 1120  "

## 2024-11-25 NOTE — ED Triage Notes (Signed)
 Pt states that he has been sick, it's been getting hard to breathe. He states that his left arm and chest have been giving him pain and left hand feels numb intermittently. The first time this happened was a week ago.

## 2024-11-25 NOTE — Discharge Instructions (Signed)
 Please follow-up with cardiology.  Return if symptoms worsen.

## 2024-11-27 ENCOUNTER — Encounter (HOSPITAL_BASED_OUTPATIENT_CLINIC_OR_DEPARTMENT_OTHER): Payer: Self-pay

## 2024-11-27 ENCOUNTER — Other Ambulatory Visit: Payer: Self-pay

## 2024-11-27 ENCOUNTER — Emergency Department (HOSPITAL_BASED_OUTPATIENT_CLINIC_OR_DEPARTMENT_OTHER): Payer: Self-pay

## 2024-11-27 DIAGNOSIS — R0789 Other chest pain: Secondary | ICD-10-CM | POA: Insufficient documentation

## 2024-11-27 DIAGNOSIS — Z5321 Procedure and treatment not carried out due to patient leaving prior to being seen by health care provider: Secondary | ICD-10-CM | POA: Insufficient documentation

## 2024-11-27 DIAGNOSIS — M79602 Pain in left arm: Secondary | ICD-10-CM | POA: Insufficient documentation

## 2024-11-27 LAB — CBC
HCT: 44.3 % (ref 39.0–52.0)
Hemoglobin: 16.3 g/dL (ref 13.0–17.0)
MCH: 31.5 pg (ref 26.0–34.0)
MCHC: 36.8 g/dL — ABNORMAL HIGH (ref 30.0–36.0)
MCV: 85.5 fL (ref 80.0–100.0)
Platelets: 196 K/uL (ref 150–400)
RBC: 5.18 MIL/uL (ref 4.22–5.81)
RDW: 12.4 % (ref 11.5–15.5)
WBC: 8.5 K/uL (ref 4.0–10.5)
nRBC: 0 % (ref 0.0–0.2)

## 2024-11-27 LAB — BASIC METABOLIC PANEL WITH GFR
Anion gap: 16 — ABNORMAL HIGH (ref 5–15)
BUN: 24 mg/dL — ABNORMAL HIGH (ref 6–20)
CO2: 22 mmol/L (ref 22–32)
Calcium: 9.8 mg/dL (ref 8.9–10.3)
Chloride: 102 mmol/L (ref 98–111)
Creatinine, Ser: 1.77 mg/dL — ABNORMAL HIGH (ref 0.61–1.24)
GFR, Estimated: 44 mL/min — ABNORMAL LOW
Glucose, Bld: 124 mg/dL — ABNORMAL HIGH (ref 70–99)
Potassium: 3.9 mmol/L (ref 3.5–5.1)
Sodium: 141 mmol/L (ref 135–145)

## 2024-11-27 LAB — TROPONIN T, HIGH SENSITIVITY: Troponin T High Sensitivity: 48 ng/L — ABNORMAL HIGH (ref 0–19)

## 2024-11-27 NOTE — ED Triage Notes (Signed)
 Patient reports chest tightness and left arm pain that started about thirty minutes ago.  Ambulates with steady gait, VSS.

## 2024-11-28 ENCOUNTER — Emergency Department (HOSPITAL_BASED_OUTPATIENT_CLINIC_OR_DEPARTMENT_OTHER)
Admission: EM | Admit: 2024-11-28 | Discharge: 2024-11-28 | Discharge status: Against Medical Advice | Payer: Self-pay | Attending: Emergency Medicine | Admitting: Emergency Medicine

## 2024-12-17 DIAGNOSIS — F419 Anxiety disorder, unspecified: Secondary | ICD-10-CM | POA: Insufficient documentation

## 2024-12-17 DIAGNOSIS — I10 Essential (primary) hypertension: Secondary | ICD-10-CM | POA: Insufficient documentation

## 2024-12-21 ENCOUNTER — Encounter (HOSPITAL_COMMUNITY): Admission: EM | Disposition: A | Payer: Self-pay | Source: Home / Self Care | Attending: Internal Medicine

## 2024-12-21 ENCOUNTER — Encounter (HOSPITAL_BASED_OUTPATIENT_CLINIC_OR_DEPARTMENT_OTHER): Payer: Self-pay

## 2024-12-21 ENCOUNTER — Other Ambulatory Visit: Payer: Self-pay

## 2024-12-21 ENCOUNTER — Encounter: Payer: Self-pay | Admitting: Physician Assistant

## 2024-12-21 ENCOUNTER — Inpatient Hospital Stay (HOSPITAL_BASED_OUTPATIENT_CLINIC_OR_DEPARTMENT_OTHER)
Admission: EM | Admit: 2024-12-21 | Discharge: 2024-12-23 | DRG: 322 | Disposition: A | Discharge status: Home / Self Care | Payer: Self-pay | Attending: Internal Medicine | Admitting: Internal Medicine

## 2024-12-21 ENCOUNTER — Emergency Department (HOSPITAL_BASED_OUTPATIENT_CLINIC_OR_DEPARTMENT_OTHER): Payer: Self-pay

## 2024-12-21 DIAGNOSIS — F419 Anxiety disorder, unspecified: Secondary | ICD-10-CM | POA: Diagnosis present

## 2024-12-21 DIAGNOSIS — R7989 Other specified abnormal findings of blood chemistry: Secondary | ICD-10-CM

## 2024-12-21 DIAGNOSIS — Z8249 Family history of ischemic heart disease and other diseases of the circulatory system: Secondary | ICD-10-CM

## 2024-12-21 DIAGNOSIS — I129 Hypertensive chronic kidney disease with stage 1 through stage 4 chronic kidney disease, or unspecified chronic kidney disease: Secondary | ICD-10-CM | POA: Diagnosis present

## 2024-12-21 DIAGNOSIS — I200 Unstable angina: Secondary | ICD-10-CM

## 2024-12-21 DIAGNOSIS — E669 Obesity, unspecified: Secondary | ICD-10-CM | POA: Diagnosis present

## 2024-12-21 DIAGNOSIS — Z8669 Personal history of other diseases of the nervous system and sense organs: Secondary | ICD-10-CM

## 2024-12-21 DIAGNOSIS — N1831 Chronic kidney disease, stage 3a: Secondary | ICD-10-CM | POA: Diagnosis present

## 2024-12-21 DIAGNOSIS — Z7982 Long term (current) use of aspirin: Secondary | ICD-10-CM

## 2024-12-21 DIAGNOSIS — Z6830 Body mass index (BMI) 30.0-30.9, adult: Secondary | ICD-10-CM

## 2024-12-21 DIAGNOSIS — I214 Non-ST elevation (NSTEMI) myocardial infarction: Principal | ICD-10-CM

## 2024-12-21 DIAGNOSIS — Z79899 Other long term (current) drug therapy: Secondary | ICD-10-CM

## 2024-12-21 DIAGNOSIS — G43909 Migraine, unspecified, not intractable, without status migrainosus: Secondary | ICD-10-CM | POA: Diagnosis present

## 2024-12-21 DIAGNOSIS — Z955 Presence of coronary angioplasty implant and graft: Secondary | ICD-10-CM

## 2024-12-21 DIAGNOSIS — I2510 Atherosclerotic heart disease of native coronary artery without angina pectoris: Secondary | ICD-10-CM | POA: Diagnosis present

## 2024-12-21 DIAGNOSIS — Z833 Family history of diabetes mellitus: Secondary | ICD-10-CM

## 2024-12-21 DIAGNOSIS — Z1152 Encounter for screening for COVID-19: Secondary | ICD-10-CM

## 2024-12-21 DIAGNOSIS — F17290 Nicotine dependence, other tobacco product, uncomplicated: Secondary | ICD-10-CM | POA: Diagnosis present

## 2024-12-21 DIAGNOSIS — I10 Essential (primary) hypertension: Secondary | ICD-10-CM | POA: Diagnosis present

## 2024-12-21 DIAGNOSIS — E785 Hyperlipidemia, unspecified: Secondary | ICD-10-CM | POA: Diagnosis present

## 2024-12-21 DIAGNOSIS — R7303 Prediabetes: Secondary | ICD-10-CM | POA: Diagnosis present

## 2024-12-21 DIAGNOSIS — R079 Chest pain, unspecified: Principal | ICD-10-CM | POA: Diagnosis present

## 2024-12-21 HISTORY — PX: LEFT HEART CATH AND CORONARY ANGIOGRAPHY: CATH118249

## 2024-12-21 HISTORY — PX: CORONARY STENT INTERVENTION: CATH118234

## 2024-12-21 LAB — COMPREHENSIVE METABOLIC PANEL WITH GFR
ALT: 29 U/L (ref 0–44)
AST: 21 U/L (ref 15–41)
Albumin: 4.1 g/dL (ref 3.5–5.0)
Alkaline Phosphatase: 68 U/L (ref 38–126)
Anion gap: 9 (ref 5–15)
BUN: 21 mg/dL — ABNORMAL HIGH (ref 6–20)
CO2: 24 mmol/L (ref 22–32)
Calcium: 9.5 mg/dL (ref 8.9–10.3)
Chloride: 107 mmol/L (ref 98–111)
Creatinine, Ser: 1.65 mg/dL — ABNORMAL HIGH (ref 0.61–1.24)
GFR, Estimated: 48 mL/min — ABNORMAL LOW
Glucose, Bld: 116 mg/dL — ABNORMAL HIGH (ref 70–99)
Potassium: 4.3 mmol/L (ref 3.5–5.1)
Sodium: 140 mmol/L (ref 135–145)
Total Bilirubin: 0.4 mg/dL (ref 0.0–1.2)
Total Protein: 6.4 g/dL — ABNORMAL LOW (ref 6.5–8.1)

## 2024-12-21 LAB — CBC WITH DIFFERENTIAL/PLATELET
Abs Immature Granulocytes: 0.02 K/uL (ref 0.00–0.07)
Basophils Absolute: 0 K/uL (ref 0.0–0.1)
Basophils Relative: 0 %
Eosinophils Absolute: 0.2 K/uL (ref 0.0–0.5)
Eosinophils Relative: 3 %
HCT: 41.8 % (ref 39.0–52.0)
Hemoglobin: 14.7 g/dL (ref 13.0–17.0)
Immature Granulocytes: 0 %
Lymphocytes Relative: 39 %
Lymphs Abs: 2.7 K/uL (ref 0.7–4.0)
MCH: 31.2 pg (ref 26.0–34.0)
MCHC: 35.2 g/dL (ref 30.0–36.0)
MCV: 88.7 fL (ref 80.0–100.0)
Monocytes Absolute: 0.5 K/uL (ref 0.1–1.0)
Monocytes Relative: 7 %
Neutro Abs: 3.5 K/uL (ref 1.7–7.7)
Neutrophils Relative %: 51 %
Platelets: 169 K/uL (ref 150–400)
RBC: 4.71 MIL/uL (ref 4.22–5.81)
RDW: 13 % (ref 11.5–15.5)
WBC: 6.9 K/uL (ref 4.0–10.5)
nRBC: 0 % (ref 0.0–0.2)

## 2024-12-21 LAB — CBC
HCT: 46.1 % (ref 39.0–52.0)
Hemoglobin: 16.3 g/dL (ref 13.0–17.0)
MCH: 31.5 pg (ref 26.0–34.0)
MCHC: 35.4 g/dL (ref 30.0–36.0)
MCV: 89 fL (ref 80.0–100.0)
Platelets: 190 K/uL (ref 150–400)
RBC: 5.18 MIL/uL (ref 4.22–5.81)
RDW: 13.3 % (ref 11.5–15.5)
WBC: 8 K/uL (ref 4.0–10.5)
nRBC: 0 % (ref 0.0–0.2)

## 2024-12-21 LAB — RESP PANEL BY RT-PCR (RSV, FLU A&B, COVID)  RVPGX2
Influenza A by PCR: NEGATIVE
Influenza B by PCR: NEGATIVE
Resp Syncytial Virus by PCR: NEGATIVE
SARS Coronavirus 2 by RT PCR: NEGATIVE

## 2024-12-21 LAB — CREATININE, SERUM
Creatinine, Ser: 1.41 mg/dL — ABNORMAL HIGH (ref 0.61–1.24)
GFR, Estimated: 58 mL/min — ABNORMAL LOW

## 2024-12-21 LAB — PRO BRAIN NATRIURETIC PEPTIDE: Pro Brain Natriuretic Peptide: 417 pg/mL — ABNORMAL HIGH

## 2024-12-21 LAB — POCT ACTIVATED CLOTTING TIME: Activated Clotting Time: 465 s

## 2024-12-21 LAB — TROPONIN T, HIGH SENSITIVITY
Troponin T High Sensitivity: 113 ng/L (ref 0–19)
Troponin T High Sensitivity: 109 ng/L (ref 0–19)

## 2024-12-21 LAB — HEMOGLOBIN A1C
Hgb A1c MFr Bld: 5.7 % — ABNORMAL HIGH (ref 4.8–5.6)
Mean Plasma Glucose: 116.89 mg/dL

## 2024-12-21 LAB — HEPARIN LEVEL (UNFRACTIONATED): Heparin Unfractionated: 0.46 [IU]/mL (ref 0.30–0.70)

## 2024-12-21 MED ORDER — SODIUM CHLORIDE 0.9% FLUSH
3.0000 mL | Freq: Two times a day (BID) | INTRAVENOUS | Status: DC
  Administered 2024-12-21 – 2024-12-23 (×4): 3 mL via INTRAVENOUS

## 2024-12-21 MED ORDER — FENTANYL CITRATE (PF) 100 MCG/2ML IJ SOLN
INTRAMUSCULAR | Status: DC | PRN
  Administered 2024-12-21: 25 ug via INTRAVENOUS

## 2024-12-21 MED ORDER — FENTANYL CITRATE (PF) 100 MCG/2ML IJ SOLN
INTRAMUSCULAR | Status: AC
  Filled 2024-12-21: qty 2

## 2024-12-21 MED ORDER — LABETALOL HCL 5 MG/ML IV SOLN
10.0000 mg | INTRAVENOUS | Status: AC | PRN
  Filled 2024-12-21: qty 4

## 2024-12-21 MED ORDER — VERAPAMIL HCL 2.5 MG/ML IV SOLN
INTRAVENOUS | Status: AC
  Filled 2024-12-21: qty 2

## 2024-12-21 MED ORDER — MIDAZOLAM HCL (PF) 2 MG/2ML IJ SOLN
INTRAMUSCULAR | Status: DC | PRN
  Administered 2024-12-21: 2 mg via INTRAVENOUS

## 2024-12-21 MED ORDER — HEPARIN (PORCINE) 25000 UT/250ML-% IV SOLN
1400.0000 [IU]/h | INTRAVENOUS | Status: DC
  Administered 2024-12-21: 1400 [IU]/h via INTRAVENOUS
  Filled 2024-12-21: qty 250

## 2024-12-21 MED ORDER — HEPARIN (PORCINE) IN NACL 1000-0.9 UT/500ML-% IV SOLN
INTRAVENOUS | Status: DC | PRN
  Administered 2024-12-21: 1000 mL via SURGICAL_CAVITY

## 2024-12-21 MED ORDER — HEPARIN SODIUM (PORCINE) 1000 UNIT/ML IJ SOLN
INTRAMUSCULAR | Status: DC | PRN
  Administered 2024-12-21: 5000 [IU] via INTRAVENOUS
  Administered 2024-12-21: 4000 [IU] via INTRAVENOUS

## 2024-12-21 MED ORDER — ACETAMINOPHEN 650 MG RE SUPP: 650.0000 mg | Freq: Four times a day (QID) | RECTAL | Status: DC | PRN

## 2024-12-21 MED ORDER — TICAGRELOR 90 MG PO TABS: 90.0000 mg | ORAL_TABLET | Freq: Two times a day (BID) | ORAL | Status: DC

## 2024-12-21 MED ORDER — HYDRALAZINE HCL 20 MG/ML IJ SOLN: 10.0000 mg | INTRAMUSCULAR | Status: AC | PRN

## 2024-12-21 MED ORDER — FREE WATER
500.0000 mL | Freq: Once | Status: AC
  Administered 2024-12-21: 500 mL via ORAL

## 2024-12-21 MED ORDER — IOHEXOL 350 MG/ML SOLN
100.0000 mL | Freq: Once | INTRAVENOUS | Status: AC | PRN
  Administered 2024-12-21: 85 mL via INTRAVENOUS

## 2024-12-21 MED ORDER — SODIUM CHLORIDE 0.9 % IV BOLUS
1000.0000 mL | Freq: Once | INTRAVENOUS | Status: AC
  Administered 2024-12-21: 1000 mL via INTRAVENOUS

## 2024-12-21 MED ORDER — ONDANSETRON HCL 4 MG PO TABS: 4.0000 mg | ORAL_TABLET | Freq: Four times a day (QID) | ORAL | Status: DC | PRN

## 2024-12-21 MED ORDER — TICAGRELOR 90 MG PO TABS
ORAL_TABLET | ORAL | Status: DC | PRN
  Administered 2024-12-21: 180 mg via ORAL

## 2024-12-21 MED ORDER — LORAZEPAM 2 MG/ML IJ SOLN
0.5000 mg | Freq: Four times a day (QID) | INTRAMUSCULAR | Status: DC | PRN
  Administered 2024-12-22: 0.5 mg via INTRAVENOUS
  Filled 2024-12-21: qty 1

## 2024-12-21 MED ORDER — ATORVASTATIN CALCIUM 80 MG PO TABS
80.0000 mg | ORAL_TABLET | Freq: Every day | ORAL | Status: DC
  Administered 2024-12-22 – 2024-12-23 (×2): 80 mg via ORAL
  Filled 2024-12-21 (×2): qty 1

## 2024-12-21 MED ORDER — IOHEXOL 350 MG/ML SOLN
INTRAVENOUS | Status: DC | PRN
  Administered 2024-12-21: 80 mL via INTRA_ARTERIAL

## 2024-12-21 MED ORDER — SODIUM CHLORIDE 0.9% FLUSH: 3.0000 mL | INTRAVENOUS | Status: DC | PRN

## 2024-12-21 MED ORDER — MIDAZOLAM HCL 2 MG/2ML IJ SOLN
INTRAMUSCULAR | Status: AC
  Filled 2024-12-21: qty 2

## 2024-12-21 MED ORDER — VERAPAMIL HCL 2.5 MG/ML IV SOLN
INTRAVENOUS | Status: DC | PRN
  Administered 2024-12-21: 10 mL via INTRA_ARTERIAL

## 2024-12-21 MED ORDER — ONDANSETRON HCL 4 MG/2ML IJ SOLN: 4.0000 mg | Freq: Four times a day (QID) | INTRAMUSCULAR | Status: DC | PRN

## 2024-12-21 MED ORDER — IPRATROPIUM-ALBUTEROL 0.5-2.5 (3) MG/3ML IN SOLN
3.0000 mL | Freq: Once | RESPIRATORY_TRACT | Status: AC
  Administered 2024-12-21: 3 mL via RESPIRATORY_TRACT
  Filled 2024-12-21: qty 3

## 2024-12-21 MED ORDER — SENNOSIDES-DOCUSATE SODIUM 8.6-50 MG PO TABS: 1.0000 | ORAL_TABLET | Freq: Every evening | ORAL | Status: DC | PRN

## 2024-12-21 MED ORDER — SODIUM CHLORIDE 0.9 % IV SOLN: 250.0000 mL | INTRAVENOUS | Status: AC | PRN

## 2024-12-21 MED ORDER — ORAL CARE MOUTH RINSE: 15.0000 mL | OROMUCOSAL | Status: DC | PRN

## 2024-12-21 MED ORDER — HEPARIN SODIUM (PORCINE) 1000 UNIT/ML IJ SOLN
INTRAMUSCULAR | Status: AC
  Filled 2024-12-21: qty 10

## 2024-12-21 MED ORDER — LIDOCAINE HCL (PF) 1 % IJ SOLN
INTRAMUSCULAR | Status: AC
  Filled 2024-12-21: qty 30

## 2024-12-21 MED ORDER — NITROGLYCERIN 1 MG/10 ML FOR IR/CATH LAB
INTRA_ARTERIAL | Status: AC
  Filled 2024-12-21: qty 10

## 2024-12-21 MED ORDER — MORPHINE SULFATE (PF) 4 MG/ML IV SOLN
4.0000 mg | Freq: Once | INTRAVENOUS | Status: AC
  Administered 2024-12-21: 4 mg via INTRAVENOUS
  Filled 2024-12-21: qty 1

## 2024-12-21 MED ORDER — TICAGRELOR 90 MG PO TABS
90.0000 mg | ORAL_TABLET | Freq: Two times a day (BID) | ORAL | Status: DC
  Administered 2024-12-22 – 2024-12-23 (×3): 90 mg via ORAL
  Filled 2024-12-21 (×3): qty 1

## 2024-12-21 MED ORDER — NITROGLYCERIN 2 % TD OINT: 1.0000 [in_us] | TOPICAL_OINTMENT | Freq: Four times a day (QID) | TRANSDERMAL | Status: DC | PRN

## 2024-12-21 MED ORDER — FLUTICASONE PROPIONATE 50 MCG/ACT NA SUSP: 1.0000 | Freq: Two times a day (BID) | NASAL | Status: DC | PRN

## 2024-12-21 MED ORDER — ASPIRIN 81 MG PO CHEW
324.0000 mg | CHEWABLE_TABLET | Freq: Once | ORAL | Status: DC
  Filled 2024-12-21: qty 4

## 2024-12-21 MED ORDER — OXYCODONE HCL 5 MG PO TABS
5.0000 mg | ORAL_TABLET | Freq: Once | ORAL | Status: DC | PRN
  Filled 2024-12-21: qty 1

## 2024-12-21 MED ORDER — ASPIRIN 81 MG PO TBEC
81.0000 mg | DELAYED_RELEASE_TABLET | Freq: Every day | ORAL | Status: DC
  Administered 2024-12-22 – 2024-12-23 (×2): 81 mg via ORAL
  Filled 2024-12-21 (×2): qty 1

## 2024-12-21 MED ORDER — ENOXAPARIN SODIUM 40 MG/0.4ML IJ SOSY
40.0000 mg | PREFILLED_SYRINGE | INTRAMUSCULAR | Status: DC
  Administered 2024-12-22: 40 mg via SUBCUTANEOUS
  Filled 2024-12-21 (×2): qty 0.4

## 2024-12-21 MED ORDER — NITROGLYCERIN 1 MG/10 ML FOR IR/CATH LAB
INTRA_ARTERIAL | Status: DC | PRN
  Administered 2024-12-21: 200 ug via INTRACORONARY

## 2024-12-21 MED ORDER — TICAGRELOR 90 MG PO TABS
ORAL_TABLET | ORAL | Status: AC
  Filled 2024-12-21: qty 2

## 2024-12-21 MED ORDER — LORAZEPAM 2 MG/ML IJ SOLN
0.5000 mg | Freq: Once | INTRAMUSCULAR | Status: AC
  Administered 2024-12-21: 0.5 mg via INTRAVENOUS
  Filled 2024-12-21: qty 1

## 2024-12-21 MED ORDER — ACETAMINOPHEN 325 MG PO TABS
650.0000 mg | ORAL_TABLET | Freq: Four times a day (QID) | ORAL | Status: DC | PRN
  Administered 2024-12-21 – 2024-12-22 (×4): 650 mg via ORAL
  Filled 2024-12-21 (×4): qty 2

## 2024-12-21 MED ORDER — HEPARIN BOLUS VIA INFUSION
4000.0000 [IU] | Freq: Once | INTRAVENOUS | Status: AC
  Administered 2024-12-21: 4000 [IU] via INTRAVENOUS

## 2024-12-21 MED ORDER — LOSARTAN POTASSIUM 25 MG PO TABS
25.0000 mg | ORAL_TABLET | Freq: Every day | ORAL | Status: DC
  Administered 2024-12-21 – 2024-12-22 (×2): 25 mg via ORAL
  Filled 2024-12-21 (×2): qty 1

## 2024-12-21 MED ORDER — LIDOCAINE HCL (PF) 1 % IJ SOLN
INTRAMUSCULAR | Status: DC | PRN
  Administered 2024-12-21: 2 mL

## 2024-12-21 NOTE — ED Provider Notes (Signed)
 On my assessment, patient is sleeping denies any acute complaints.  He is overall well-appearing and awaiting placement   Midge Golas, MD 12/21/24 [phone]

## 2024-12-21 NOTE — Assessment & Plan Note (Signed)
 Sumatriptan  will not be resumed on admission As needed acetaminophen  ordered for headaches

## 2024-12-21 NOTE — Progress Notes (Signed)
 PHARMACY - ANTICOAGULATION CONSULT NOTE  Pharmacy Consult for heparin  Indication: chest pain/ACS  Allergies[1]  Patient Measurements: Height: 6' 1 (185.4 cm) Weight: 104.3 kg (230 lb) IBW/kg (Calculated) : 79.9 HEPARIN  DW (KG): 101.2  Vital Signs: Temp: 98.1 F (36.7 C) (01/19 0015) Temp Source: Oral (01/19 0015) BP: 154/88 (01/19 0100) Pulse Rate: 75 (01/19 0103)  Labs: Recent Labs    12/21/24 0100  HGB 14.7  HCT 41.8  PLT 169  CREATININE 1.65*    Estimated Creatinine Clearance: 62.7 mL/min (A) (by C-G formula based on SCr of 1.65 mg/dL (H)).   Medical History: Past Medical History:  Diagnosis Date   Anxiety    Anxiety state 07/15/2019   Environmental allergies 07/15/2019   Essential hypertension 07/15/2019   History of migraine 07/15/2019   Hypertension    Assessment: 58yo male c/o CP x65mo, radiates to LUE and associated w/ SOB, initial troponins elevated but flat >> to begin heparin .  Goal of Therapy:  Heparin  level 0.3-0.7 units/ml Monitor platelets by anticoagulation protocol: Yes   Plan:  Heparin  4000 units IV bolus followed by infusion at 1400 units/hr. Monitor heparin  levels and CBC.  Marvetta Dauphin, PharmD, BCPS  12/21/2024,3:35 AM      [1] No Known Allergies

## 2024-12-21 NOTE — Interval H&P Note (Signed)
 History and Physical Interval Note:  12/21/2024 4:50 PM  Julian Myers  has presented today for surgery, with the diagnosis of cad.  The various methods of treatment have been discussed with the patient and family. After consideration of risks, benefits and other options for treatment, the patient has consented to  Procedures: LEFT HEART CATH AND CORONARY ANGIOGRAPHY (N/A) as a surgical intervention.  The patient's history has been reviewed, patient examined, no change in status, stable for surgery.  I have reviewed the patient's chart and labs.  Questions were answered to the patient's satisfaction.   Cath Lab Visit (complete for each Cath Lab visit)  Clinical Evaluation Leading to the Procedure:   ACS: Yes.    Non-ACS:    Anginal Classification: CCS IV  Anti-ischemic medical therapy: No Therapy  Non-Invasive Test Results: No non-invasive testing performed  Prior CABG: No previous CABG        Julian Myers 12/21/2024 4:50 PM'

## 2024-12-21 NOTE — ED Triage Notes (Signed)
 Pt arrives with c/o CP that has been ongoing for about a month. Pt reports pain radiates down his left arm and SOB. Pt has recently had the flu.

## 2024-12-21 NOTE — Assessment & Plan Note (Signed)
 Ativan  0.5 mg IV one-time dose

## 2024-12-21 NOTE — ED Notes (Signed)
"  Pt declined to change into hospital gown at this time  "

## 2024-12-21 NOTE — Hospital Course (Signed)
 Mr. Julian Myers is a 59 year old male with history of CKD stage IIIa/2, occasional cigar smoking, occasional EtOH use, overweight, migraine headaches, hypertension.  12/21/2024: Patient presented to the ED for chief concerns of chest pain with associated shortness of breath.  Patient admitted to hospital service for the same pending bed availability.  Vitals at the time of my evaluation showed t of 97.9, rr 17, hr 73, BP 142/70, SpO2 94% on room air.  Serum sodium is 140, potassium 4.3, chloride 107, bicarb 24, BUN of 21, serum creatinine 1.65, eGFR 48, nonfasting blood glucose 116, WBC 6.9, hemoglobin 14.7, platelets 169.  BNP was elevated at 417. HS troponin was initially 113 and on repeat was 109.  COVID/influenza A/influenza B/RSV PCR were negative.  ED treatment: Morphine  4 mg IV one-time dose, sodium chloride  1 L bolus, heparin  bolus and gtt. initiated.  EDP discussed with cardiology who recommends hospitalization and they will consult.  12/21/2024: I assumed care of the patient. Patient was seen and evaluated via telemedicine encounter. Patient will be admitted to [facility], telemetry.

## 2024-12-21 NOTE — H&P (View-Only) (Signed)
 "  Cardiology Consultation   Patient ID: Julian Myers MRN: [id number]; DOB: August 15, 1966  Admit date: 12/21/2024 Date of Consult: 12/21/2024  PCP:  [facility]   [facility] Providers Cardiologist:  Jomo Forand K Carmelita Amparo, MD        Patient Profile: Julian Myers is a 59 y.o. male with a history of hypertension, anxiety, and migraines who is being seen 12/21/2024 for the evaluation of chest pain at the request of Dr. Sherre.  History of Present Illness: Julian Myers is a 59 year old male with the above history.  No prior cardiac history.  Patient was recently seen in the ED on 11/25/2024 for further evaluation of left-sided chest pain with some tingling down his left arm.  EKG showed normal sinus rhythm with no acute ischemic changes.  High 60 troponin was minimally elevated and flat at 58 >> 56.  D-dimer was elevated but chest CTA was negative for PE.  Troponin elevation was felt to possibly due to his CKD.  He was felt to be stable for discharge and was referred to cardiology as an outpatient.  He was seen back in the ED on 11/27/2024 for the same symptoms.  EKG again showed no acute ischemic changes.  High-sensitivity troponin was stable at 48. However, it looks like he left before being seen.  Patient presented to the [facility] ED earlier this morning for ongoing chest pain. EKG showed normal sinus rhythm, rate 81 bpm, with LVH and no acute ischemic changes. High-sensitivity troponin 113 >> 109. Pro-BNP was 417.  Chest CTA was negative for PE but did show two-vessel coronary artery calcifications (greatest in the LAD). WBC 6.9, Hgb 14.7, Plts 169. Na 140, K 4.3, Glucose 116, BUN 21, Cr 1.65. Respiratory panel negative for COVID, influenza A/B, and RSV. He was started on IV Heparin  and Nitropaste with plans to admit to [facility]. Due to prolonged wait times, he was transferred to the [facility] ED.  Patient has been having progressive angina for about 1.5 months. He states he  first noticed it when he was having sex with his significant other. He developed chest pain and dyspnea with this and had associated lightheadedness, a flush sensation, and near syncope. He then started to notice similar symptoms while at work. 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 He described one episode while hunting in early 11/2024 where he actually fell to his knees climbing a hill because the pain was so bad. His symptoms have progressed and are now occurring with minimal activity like walking around the house and even at rest. He states symptom seem to be worse at night and can last for hours. The chest pain will radiated to his left arm. He also describes some possible orthopnea and PND lately. No edema. He has some palpitations with the chest pain and shortness of breath but nothing outside these times. No syncope. He states he had the flu a couple of weeks ago but has recovered from that . No other recent fevers or illnesses. No abnormal bleeding in urine or stools.  Currently, he is having left upper arm pain as well as a little left sided chest pain.   He smokes cigars a couple of times a weeks. He also reports he will typically have a couple of beers or mixed drinks about 3 nights per week.   He has a family history of heart disease. His maternal grandfather had a MI at an older age.  Past Medical History:  Diagnosis Date   Anxiety    Anxiety state 07/15/2019   Environmental allergies 07/15/2019   Essential hypertension 07/15/2019   History of migraine 07/15/2019   Hypertension     Past Surgical History:  Procedure Laterality Date   ANKLE SURGERY     BACK SURGERY     NECK SURGERY       Home Medications:  Prior to Admission medications  Medication Sig Start Date End Date Taking? Authorizing Provider  aspirin  325 MG tablet Take 325 mg by mouth daily.   Yes [provider]  amoxicillin -clavulanate (AUGMENTIN ) 875-125 MG tablet Take 1 tablet by mouth 2 (two) times  daily. Patient not taking: Reported on 12/21/2024 02/12/23   Sofia, Leslie K, PA-C  fluticasone  (FLONASE ) 50 MCG/ACT nasal spray 1 or 2 sprays each nostril twice a day Patient not taking: Reported on 12/21/2024 08/15/19   Jaycee Lenis, MD  SUMAtriptan  (IMITREX ) 50 MG tablet Take 1 tablet (50 mg total) by mouth once as needed for up to 1 dose for migraine. May repeat in 2 hours if headache persists or recurs. Patient not taking: Reported on 12/21/2024 07/14/19   Alexander, Natalie, DO    Scheduled Meds:  losartan   25 mg Oral Daily   Continuous Infusions:  heparin  1,400 Units/hr (12/21/24 0348)   PRN Meds: acetaminophen  **OR** acetaminophen , fluticasone , LORazepam , nitroGLYCERIN , ondansetron  **OR** ondansetron  (ZOFRAN ) IV, senna-docusate  Allergies:   Allergies[1]  Social History:   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: [employer]  Tobacco Use   Smoking status: Some Days    Types: Cigars   Smokeless tobacco: Never  Vaping Use   Vaping status: Never Used  Substance and Sexual Activity   Alcohol use: Yes    Comment: occ   Drug use: No   Sexual activity: Not on file  Other Topics Concern   Not on file  Social History Narrative   Not on file   Social Drivers of Health   Tobacco Use: High Risk (12/21/2024)   Patient History    Smoking Tobacco Use: Some Days    Smokeless Tobacco Use: Never    Passive Exposure: Not on file  Financial Resource Strain: Not on file  Food Insecurity: No Food Insecurity (12/21/2024)   Epic    Worried About Programme Researcher, Broadcasting/film/video in the Last Year: Never true    Ran Out of Food in the Last Year: Never true  Transportation Needs: No Transportation Needs (12/21/2024)   Epic    Lack of Transportation (Medical): No    Lack of  Transportation (Non-Medical): No  Physical Activity: Not on file  Stress: Not on file  Social Connections: Unknown (01/23/2023)   Received from [facility]   Social Network    Social Network: Not on file  Intimate Partner Violence: Not At Risk (12/21/2024)   Epic    Fear of Current or Ex-Partner: No    Emotionally Abused: No    Physically Abused: No    Sexually Abused: No  Depression (PHQ2-9): Not on file  Alcohol Screen: Not on file  Housing: Low Risk (12/21/2024)   Epic    Unable to Pay for Housing in the Last Year: No    Number of  Times Moved in the Last Year: 0    Homeless in the Last Year: No  Utilities: Not At Risk (12/21/2024)   Epic    Threatened with loss of utilities: No  Health Literacy: Not on file    Family History:   Family History  Problem Relation Age of Onset   Diabetes Mother    Hypertension Mother    Hypertension Father      ROS:  Please see the history of present illness.   Physical Exam/Data: Vitals:   12/21/24 0900 12/21/24 1130 12/21/24 1239 12/21/24 1330  BP:  136/84 (!) 153/81 (!) 149/85  Pulse: 93 82 73 68  Resp: (!) 23 13 20 15   Temp:   97.9 F (36.6 C) 97.9 F (36.6 C)  TempSrc:   Oral Oral  SpO2: 99% 95% 95% 95%  Weight:      Height:       No intake or output data in the 24 hours ending 12/21/24 1637    12/21/2024   12:13 AM 11/25/2024    7:54 AM 02/12/2023   10:04 AM  Last 3 Weights  Weight (lbs) 230 lb 230 lb 222 lb  Weight (kg) 104.327 kg 104.327 kg 100.699 kg     Body mass index is 30.34 kg/m.  General: 59 y.o. male resting comfortably in no acute distress. HEENT: Normocephalic and atraumatic. Sclera clear. EOMs intact. Neck: Supple. No carotid bruits. No JVD. Heart: RRR. Distinct S1 and S2. No murmurs, gallops, or rubs. Radial pulses 2+ and equal bilaterally. Lungs: No increased work of breathing. Clear to ausculation bilaterally. No wheezes, rhonchi, or rales.  Abdomen: Soft, non-distended, and non-tender to palpation.   Extremities: No lower extremity edema. Skin: Warm and dry. Neuro: Alert and oriented x3. No focal deficits. Psych: Normal affect. Responds appropriately.  EKG:  The EKG was personally reviewed and demonstrates:  Normal sinus rhythm, rate 81 bpm, with LVH and no acute ischemic changes. Telemetry:  Telemetry was personally reviewed and demonstrates:  Normal sinus rhythm with rates in the 70s to 80s.  Relevant CV Studies: N/A.  Laboratory Data: High Sensitivity Troponin:  No results for input(s): TROPONINIHS in the last 720 hours.  Recent Labs  Lab 11/25/24 0825 11/25/24 1026 11/27/24 2118 12/21/24 0100 12/21/24 0209  TRNPT 58* 56* 48* 113* 109*      Chemistry Recent Labs  Lab 12/21/24 0100  NA 140  K 4.3  CL 107  CO2 24  GLUCOSE 116*  BUN 21*  CREATININE 1.65*  CALCIUM  9.5  GFRNONAA 48*  ANIONGAP 9    Recent Labs  Lab 12/21/24 0100  PROT 6.4*  ALBUMIN 4.1  AST 21  ALT 29  ALKPHOS 68  BILITOT 0.4   Lipids No results for input(s): CHOL, TRIG, HDL, LABVLDL, LDLCALC, CHOLHDL in the last 168 hours.  Hematology Recent Labs  Lab 12/21/24 0100  WBC 6.9  RBC 4.71  HGB 14.7  HCT 41.8  MCV 88.7  MCH 31.2  MCHC 35.2  RDW 13.0  PLT 169   Thyroid No results for input(s): TSH, FREET4 in the last 168 hours.  BNP Recent Labs  Lab 12/21/24 0100  PROBNP 417.0*    DDimer No results for input(s): DDIMER in the last 168 hours.  Radiology/Studies:  CT Angio Chest PE W and/or Wo Contrast Result Date: 12/21/2024 EXAM: CTA CHEST 12/21/2024 02:20:00 AM TECHNIQUE: CTA of the chest was performed without and with the administration of 85 mL of iohexol  (OMNIPAQUE ) 350 MG/ML injection. Multiplanar  reformatted images are provided for review. MIP images are provided for review. Automated exposure control, iterative reconstruction, and/or weight based adjustment of the mA/kV was utilized to reduce the radiation dose to as low as reasonably achievable.  COMPARISON: CTA chest from 11/25/2024 and PA lateral chest 11/27/2024. CLINICAL HISTORY: Chest pain and shortness of breath ongoing for roughly 1 month, with radiation down the left upper extremity. Pulmonary embolism (PE) suspected, high prob. FINDINGS: PULMONARY ARTERIES: Pulmonary arteries are adequately opacified for evaluation. No acute pulmonary embolus. Main pulmonary artery is normal in caliber. MEDIASTINUM: The cardiac size is normal. There are patchy calcifications in the LAD coronary artery, minimal scattered calcification in the circumflex artery. Minimal pericardial effusion again noted anteriorly. There is trace calcified plaque in the distal aortic arch. The aorta and great vessels are otherwise normal. The pulmonary veins are nondistended. LYMPH NODES: No mediastinal, hilar or axillary lymphadenopathy. LUNGS AND PLEURA: The lungs are without acute process. No focal consolidation or pulmonary edema. No evidence of pleural effusion or pneumothorax. UPPER ABDOMEN: There is a small hiatal hernia. In the abdomen, the liver demonstrates mild to moderate steatosis. No acute upper abdominal abnormality. SOFT TISSUES AND BONES: Thoracic spondylosis and multilevel bridging enthesopathy. Small amount of subareolar dendritic gynecomastia of both breasts. IMPRESSION: 1. No evidence of pulmonary embolism. No acute chest CT or CTA findings. 2. Minimal pericardial effusion anteriorly. 3. 2 vessel Coronary artery calcifications, greatest in the LAD. 4. Small hiatal hernia. Electronically signed by: Francis Quam MD 12/21/2024 02:49 AM EST RP Workstation: [id number]     Assessment and Plan:  NSTEMI Patient has had progressive angina over the last 1.5 months with chest pain with radiation to left arm and dyspnea that initially was with exertion only and is now occurring at rest. EKG showed normal sinus rhythm with no acute ischemic changes. High-sensitivity troponin mildly elevated at 113 >> 109. Chest CTA  negative for PE. - He is currently having left arm pain and a little left sided chest pain.  - Echo pending. - Continue IV Heparin .  - Will start Aspirin  81mg  daily and Lipitor 80mg  daily.  - Will check lipid panel and hemoglobin A1c. - Will proceed with LHC today. The patient understands that risks include but are not limited to stroke (1 in 1000), death (1 in 1000), kidney failure [usually temporary] (1 in 500), bleeding (1 in 200), allergic reaction [possibly serious] (1 in 200), and agrees to proceed.   Hypertension BP mildly elevated. - Continue Losartan  25mg  daily. - Can continue to adjust medications after cardiac catheterization.  CKD Stage III Baseline around creatinine around 1.4 to 1.7.  - Creatinine stable at 1.65.  - Continue to monitor.  Risk Assessment/Risk Scores: TIMI Risk Score for Unstable Angina or Non-ST Elevation MI:   The patient's TIMI risk score is 3, which indicates a 13% risk of all cause mortality, new or recurrent myocardial infarction or need for urgent revascularization in the next 14 days.{   For questions or updates, please contact [facility] Please consult [email or web address] for contact info under      Signed, Callie E Goodrich, PA-C  12/21/2024 4:37 PM  ATTENDING ATTESTATION:  After conducting a review of all available clinical information with the care team, interviewing the patient, and performing a physical exam, I agree with the findings and plan described in this note with adjustments as indicated below which were discussed and enacted by staff above.   GEN: Mildly uncomfortable, AO x 3 HEENT:  MMM,  no JVD, no scleral icterus Cardiac: RRR, no murmurs, rubs, or gallops.  Respiratory: Clear to auscultation bilaterally. GI: Soft, nontender, non-distended  MS: No edema; No deformity. Neuro:  Nonfocal  Vasc:  +2 radial pulses  Patient is a 59 year old male with a history of hypertension, anxiety, migraines, and CKD stage III who is  here with unstable angina.  The patient is 7 been experiencing crescendo angina over the last few weeks.  Because of worsening symptoms that occurred basically at rest he presented to the emergency department.  His EKG here demonstrates no ischemic changes.  Despite a heparin  infusion, relatively normal tension, and not being tachycardic with a heart rate in the 80s the patient continues to have angina about 4 out of 10 severity.  I recommend we proceed to urgent coronary angiography today.  I suspect he likely has a left circumflex lesion.  He has a +2 right radial pulse and no contraindications to DAPT.  I have reviewed the risks, indications, and alternatives to cardiac catheterization, possible angioplasty, and stenting with the patient. Risks include but are not limited to bleeding, infection, vascular injury, stroke, myocardial infection, arrhythmia, kidney injury, radiation-related injury in the case of prolonged fluoroscopy use, emergency cardiac surgery, and death. The patient understands the risks of serious complication is 1-2 in 1000 with diagnostic cardiac cath and 1-2% or less with angioplasty/stenting.    Deran Barro, MD Pager [phone]      [1] No Known Allergies  "

## 2024-12-21 NOTE — H&P (Signed)
 " History and Physical - Telemedicine  Karder Goodin FMW:[id number] DOB: 04-16-1966 DOA: 12/21/2024  PCP: [facility]  Patient coming from: home  Referring provider: Dr. Geroldine, EDP Telemedicine provider: Dr. Sherre Patient location: [facility] Referring diagnosis: Chest pain, NSTEMI Patient name and DOB verified: Patient was able to verify her first and last name: Julian Myers, date of birth: 10/12/1966. Patient consented to Telemedicine Evaluation: yes RN virtual assistant: Marolyn Hummer, RN Video encounter time and date: 12/21/24 and at approximately: 08:32  Chief Concern: chest pain  HPI: Mr. Simuel Stebner is a 59 year old male with history of CKD stage IIIa/2, occasional cigar smoking, occasional EtOH use, overweight, migraine headaches, hypertension.  12/21/2024: Patient presented to the ED for chief concerns of chest pain with associated shortness of breath.  Patient admitted to hospital service for the same pending bed availability.  Vitals at the time of my evaluation showed t of 97.9, rr 17, hr 73, BP 142/70, SpO2 94% on room air.  Serum sodium is 140, potassium 4.3, chloride 107, bicarb 24, BUN of 21, serum creatinine 1.65, eGFR 48, nonfasting blood glucose 116, WBC 6.9, hemoglobin 14.7, platelets 169.  BNP was elevated at 417. HS troponin was initially 113 and on repeat was 109.  COVID/influenza A/influenza B/RSV PCR were negative.  ED treatment: Morphine  4 mg IV one-time dose, sodium chloride  1 L bolus, heparin  bolus and gtt. initiated.  EDP discussed with cardiology who recommends hospitalization and they will consult.  12/21/2024: I assumed care of the patient. Patient was seen and evaluated via telemedicine encounter. Patient will be admitted to [facility], telemetry. ----------------------------------- At bedside, via telemedicine encounter, patient was able to confirm his first last name, date of birth.  He reports that he started feeling chest discomfort about 1 month ago.   Specifically 1 month ago he was hunting in the woods when he had sudden onset of chest pain and shortness of breath at which time he fell to his knees putting his hands to his chest.  He describes the chest pain as a pressure sensation.  This sensation stayed in his chest for several hours and then ultimately dissipated.  He reports was continued chest discomfort with associated shortness of breath that has gradually worsened over the last month.  He lives in a home that has 2 levels.  And he frequently walks up and down the stairs without issue however over the course of 1 month ago, he has become increasingly short of breath especially with walking up the stairs.  He denies swelling of the lower extremities.  He denies any weight gain.  Yesterday, he was having intercourse with his girlfriend when he felt like he was going to pass out.  This prompted him to present to the emergency department for further evaluation.  He describes the chest pain as chest pressure with radiation to the underside of his left neck and then left shoulder and left arm.  At times he will have radiation to bilateral arms.  He denies trauma to his person. He denies known sick contacts.  He denies dysuria, hematuria, blood in his stool, syncope, actual loss of consciousness. He endorses near syncope specifically last night during sexual intercourse.  He endorses vision changes, stating that it is more blurry over the last month.  On my evaluation, he denies current chest pain at this time. He denies nausea, vomiting, fever, chills, cough.  Social history: He lives at home with his girlfriend.  He infrequently smokes cigars.  He drinks  3-4 alcoholic beverages per week.  He drinks 1 beer or 1 cocktail or 1 glass of whiskey occasionally.  He does not drink more than 4 drinks per week.  He denies recreational drug use.  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  ROS: Constitutional: no weight change, no fever ENT/Mouth: no sore throat,  no rhinorrhea Eyes: no eye pain, no vision changes Cardiovascular: + chest pain, + dyspnea,  no edema, no palpitations Respiratory: no cough, no sputum, no wheezing Gastrointestinal: no nausea, no vomiting, no diarrhea, no constipation Genitourinary: no urinary incontinence, no dysuria, no hematuria Musculoskeletal: no arthralgias, no myalgias Skin: no skin lesions, no pruritus, Neuro: + weakness, no loss of consciousness, no syncope, + near syncope during sexual activity yesterday Psych: no anxiety, no depression, no decrease appetite Heme/Lymph: no bruising, no bleeding  Assessment/Plan  Principal Problem:   Chest pain Active Problems:   History of migraine   Essential hypertension   Anxiety   NSTEMI (non-ST elevated myocardial infarction) (HCC)   Assessment and Plan:  * Chest pain Continue heparin  GTT Complete echo ordered Nitroglycerin  ointment every 6 hours as needed for chest pain, 3 days ordered N.p.o. at this time as patient may get bed availability and possible left heart cath today Admit to telemetry, inpatient  NSTEMI (non-ST elevated myocardial infarction) (HCC) Continue heparin  GTT  Anxiety Ativan  0.5 mg IV one-time dose  Essential hypertension Home losartan  25 mg daily resumed Hydralazine  5 mg IV every 6 hours as needed for SBP greater than 165, 5 days ordered  History of migraine Sumatriptan  will not be resumed on admission As needed acetaminophen  ordered for headaches  Chart reviewed.   DVT prophylaxis: Heparin  GTT Code Status: Full code Diet: N.p.o. at this time pending cardiology evaluation and possible left heart cath today Family Communication: A phone call was offered, patient declined stating that he does not want his parents know at this time.  His girlfriend has already been up dated. Disposition Plan: Pending clinical course Consults called: Pharmacy.  Patient will need cardiology evaluation upon arrival to medical floor Admission status:  PCU, inpatient  Past Medical History:  Diagnosis Date   Anxiety    Anxiety state 07/15/2019   Environmental allergies 07/15/2019   Essential hypertension 07/15/2019   History of migraine 07/15/2019   Hypertension    Past Surgical History:  Procedure Laterality Date   ANKLE SURGERY     BACK SURGERY     NECK SURGERY     Social History:  reports that he has been smoking cigars. He has never used smokeless tobacco. He reports current alcohol use. He reports that he does not use drugs.  Allergies[1] Family History  Problem Relation Age of Onset   Diabetes Mother    Hypertension Mother    Hypertension Father    Family history: Family history reviewed and not pertinent.  Prior to Admission medications  Medication Sig Start Date End Date Taking? Authorizing Provider  amoxicillin -clavulanate (AUGMENTIN ) 875-125 MG tablet Take 1 tablet by mouth 2 (two) times daily. 02/12/23   Sofia, Leslie K, PA-C  fluticasone  (FLONASE ) 50 MCG/ACT nasal spray 1 or 2 sprays each nostril twice a day 08/15/19   Jaycee Lenis, MD  losartan  (COZAAR ) 50 MG tablet Take 0.5 tablets (25 mg total) by mouth daily. 07/14/19 07/13/20  Alexander, Natalie, DO  pantoprazole  (PROTONIX ) 40 MG tablet Take 1 tablet (40 mg total) by mouth daily. 01/17/23 04/17/23  Roselyn Carlin NOVAK, MD  SUMAtriptan  (IMITREX ) 50 MG tablet Take 1 tablet (  50 mg total) by mouth once as needed for up to 1 dose for migraine. May repeat in 2 hours if headache persists or recurs. 07/14/19   Alexander, Natalie, DO   Physical Exam completed with assistance of: Marolyn Hummer, RN, who was at bedside during this portion of the virtual encounter:  Vitals:   12/21/24 0600 12/21/24 0635 12/21/24 0700 12/21/24 0900  BP: (!) 149/79  (!) 142/70   Pulse: 72  78 93  Resp: 15  14 (!) 23  Temp:  97.9 F (36.6 C)    TempSrc:  Oral    SpO2: 95%  93% 99%  Weight:      Height:       Constitutional: appears age-appropriate, anxious, worried Eyes: EOMI,   conjunctivae normal ENMT: Mucous membranes are moist. Hearing appropriate Neck: normal, supple, no masses, no thyromegaly Respiratory: clear to auscultation bilaterally, no wheezing. Normal respiratory effort. No accessory muscle use.  Cardiovascular: Regular rate and rhythm, no murmurs. No extremity edema. Abdomen: Obese abdomen, no tenderness. Bowel sounds positive.  Musculoskeletal: No joint deformity upper and lower extremities. Good ROM, no contractures, no atrophy. Skin: no rashes, ulcers on visible skin Neurologic: Strength is appropriate upper extremities.  Psychiatric: Normal judgment and insight. Alert and oriented x 3.  Anxious mood.   EKG: independently reviewed, showing sinus rhythm with rate of 81, QTc 458  Chest x-ray on Admission: I personally reviewed and I agree with radiologist reading as below.  CT Angio Chest PE W and/or Wo Contrast Result Date: 12/21/2024 EXAM: CTA CHEST 12/21/2024 02:20:00 AM TECHNIQUE: CTA of the chest was performed without and with the administration of 85 mL of iohexol  (OMNIPAQUE ) 350 MG/ML injection. Multiplanar reformatted images are provided for review. MIP images are provided for review. Automated exposure control, iterative reconstruction, and/or weight based adjustment of the mA/kV was utilized to reduce the radiation dose to as low as reasonably achievable. COMPARISON: CTA chest from 11/25/2024 and PA lateral chest 11/27/2024. CLINICAL HISTORY: Chest pain and shortness of breath ongoing for roughly 1 month, with radiation down the left upper extremity. Pulmonary embolism (PE) suspected, high prob. FINDINGS: PULMONARY ARTERIES: Pulmonary arteries are adequately opacified for evaluation. No acute pulmonary embolus. Main pulmonary artery is normal in caliber. MEDIASTINUM: The cardiac size is normal. There are patchy calcifications in the LAD coronary artery, minimal scattered calcification in the circumflex artery. Minimal pericardial effusion again  noted anteriorly. There is trace calcified plaque in the distal aortic arch. The aorta and great vessels are otherwise normal. The pulmonary veins are nondistended. LYMPH NODES: No mediastinal, hilar or axillary lymphadenopathy. LUNGS AND PLEURA: The lungs are without acute process. No focal consolidation or pulmonary edema. No evidence of pleural effusion or pneumothorax. UPPER ABDOMEN: There is a small hiatal hernia. In the abdomen, the liver demonstrates mild to moderate steatosis. No acute upper abdominal abnormality. SOFT TISSUES AND BONES: Thoracic spondylosis and multilevel bridging enthesopathy. Small amount of subareolar dendritic gynecomastia of both breasts. IMPRESSION: 1. No evidence of pulmonary embolism. No acute chest CT or CTA findings. 2. Minimal pericardial effusion anteriorly. 3. 2 vessel Coronary artery calcifications, greatest in the LAD. 4. Small hiatal hernia. Electronically signed by: Francis Quam MD 12/21/2024 02:49 AM EST RP Workstation: [id number]   Labs on Admission: I have personally reviewed following labs.  CBC: Recent Labs  Lab 12/21/24 0100  WBC 6.9  NEUTROABS 3.5  HGB 14.7  HCT 41.8  MCV 88.7  PLT 169   Basic Metabolic Panel: Recent Labs  Lab 12/21/24 0100  NA 140  K 4.3  CL 107  CO2 24  GLUCOSE 116*  BUN 21*  CREATININE 1.65*  CALCIUM  9.5   GFR: Estimated Creatinine Clearance: 62.7 mL/min (A) (by C-G formula based on SCr of 1.65 mg/dL (H)).  Liver Function Tests: Recent Labs  Lab 12/21/24 0100  AST 21  ALT 29  ALKPHOS 68  BILITOT 0.4  PROT 6.4*  ALBUMIN 4.1   BNP (last 3 results) Recent Labs    12/21/24 0100  PROBNP 417.0*   This document was prepared using Dragon Voice Recognition software and may include unintentional dictation errors.  Dr. Sherre [facility] Location: [place]  If 7PM-7AM, please contact overnight-coverage provider If 7AM-7PM, please contact day attending provider [email or web address]  12/21/2024, 9:53  AM     [1] No Known Allergies  "

## 2024-12-21 NOTE — Assessment & Plan Note (Signed)
 Continue heparin GTT

## 2024-12-21 NOTE — ED Provider Notes (Signed)
 Patient awaiting bed at [facility], he will be transferred to the ER at [facility] Accepted by Dr Darra Midge Golas, MD 12/21/24 1254

## 2024-12-21 NOTE — Assessment & Plan Note (Signed)
 Continue heparin  GTT Complete echo ordered Nitroglycerin  ointment every 6 hours as needed for chest pain, 3 days ordered N.p.o. at this time as patient may get bed availability and possible left heart cath today Admit to telemetry, inpatient

## 2024-12-21 NOTE — Progress Notes (Addendum)
 PHARMACY - ANTICOAGULATION CONSULT NOTE  Pharmacy Consult for heparin  Indication: chest pain/ACS  Allergies[1]  Patient Measurements: Height: 6' 1 (185.4 cm) Weight: 104.3 kg (230 lb) IBW/kg (Calculated) : 79.9 HEPARIN  DW (KG): 101.2  Vital Signs: Temp: 97.9 F (36.6 C) (01/19 0635) Temp Source: Oral (01/19 0635) BP: 142/70 (01/19 0700) Pulse Rate: 93 (01/19 0900)  Labs: Recent Labs    12/21/24 0100 12/21/24 1022  HGB 14.7  --   HCT 41.8  --   PLT 169  --   HEPARINUNFRC  --  0.46  CREATININE 1.65*  --     Estimated Creatinine Clearance: 62.7 mL/min (A) (by C-G formula based on SCr of 1.65 mg/dL (H)).  Assessment: 59yo male c/o CP x7mo, radiates to LUE and associated w/ SOB, initial troponins elevated but flat. Started on IV heparin . Initial heparin  level is therapeutic at 0.46. No bleeding noted.   Goal of Therapy:  Heparin  level 0.3-0.7 units/ml Monitor platelets by anticoagulation protocol: Yes   Plan:  Continue heparin  gtt 1400 units/hr Check another heparin  level this evening Daily heparin  level and CBC  Vernell Meier, PharmD, BCEMP Clinical Pharmacist Please see AMION for all pharmacy numbers 12/21/2024 11:42 AM        [1] No Known Allergies

## 2024-12-21 NOTE — Assessment & Plan Note (Signed)
 Home losartan  25 mg daily resumed Hydralazine  5 mg IV every 6 hours as needed for SBP greater than 165, 5 days ordered

## 2024-12-21 NOTE — Progress Notes (Signed)
 Patient in ER.  Duplicate, mark erroneous.   This encounter was created in error - please disregard.

## 2024-12-21 NOTE — ED Provider Notes (Signed)
 " [place] [facility] Provider Note   CSN: [id number] Arrival date & time: 12/21/24  0003     Patient presents with: Chest Pain   Julian Myers is a 59 y.o. male.   Patient is a 59 year old male presenting with complaints of chest pain and shortness of breath.  His symptoms have been ongoing for the past month.  It he describes discomfort to his upper chest and left arm that occurs intermittently.  No fevers or chills.  He describes some cough that is mostly nonproductive.  He was seen here 1 month ago with similar complaints.  Workup was unremarkable at that time and patient was referred to cardiology.  He has an appointment the end of the month.       Prior to Admission medications  Medication Sig Start Date End Date Taking? Authorizing Provider  amoxicillin -clavulanate (AUGMENTIN ) 875-125 MG tablet Take 1 tablet by mouth 2 (two) times daily. 02/12/23   Sofia, Leslie K, PA-C  fluticasone  (FLONASE ) 50 MCG/ACT nasal spray 1 or 2 sprays each nostril twice a day 08/15/19   Jaycee Lenis, MD  losartan  (COZAAR ) 50 MG tablet Take 0.5 tablets (25 mg total) by mouth daily. 07/14/19 07/13/20  Alexander, Natalie, DO  pantoprazole  (PROTONIX ) 40 MG tablet Take 1 tablet (40 mg total) by mouth daily. 01/17/23 04/17/23  Roselyn Carlin NOVAK, MD  SUMAtriptan  (IMITREX ) 50 MG tablet Take 1 tablet (50 mg total) by mouth once as needed for up to 1 dose for migraine. May repeat in 2 hours if headache persists or recurs. 07/14/19   Alexander, Natalie, DO    Allergies: Patient has no known allergies.    Review of Systems  All other systems reviewed and are negative.   Updated Vital Signs BP (!) 186/98   Pulse 82   Temp 98.1 F (36.7 C) (Oral)   Resp 20   Ht 6' 1 (1.854 m)   Wt 104.3 kg   SpO2 98%   BMI 30.34 kg/m   Physical Exam Vitals and nursing note reviewed.  Constitutional:      General: He is not in acute distress.    Appearance: He is well-developed. He is  not diaphoretic.  HENT:     Head: Normocephalic and atraumatic.  Cardiovascular:     Rate and Rhythm: Normal rate and regular rhythm.     Heart sounds: No murmur heard.    No friction rub.  Pulmonary:     Effort: Pulmonary effort is normal. No respiratory distress.     Breath sounds: Normal breath sounds. No wheezing or rales.  Abdominal:     General: Bowel sounds are normal. There is no distension.     Palpations: Abdomen is soft.     Tenderness: There is no abdominal tenderness.  Musculoskeletal:        General: Normal range of motion.     Cervical back: Normal range of motion and neck supple.     Right lower leg: No tenderness. No edema.     Left lower leg: No tenderness. No edema.     Comments: Toula' sign is absent.  Skin:    General: Skin is warm and dry.  Neurological:     Mental Status: He is alert and oriented to person, place, and time.     Coordination: Coordination normal.     (all labs ordered are listed, but only abnormal results are displayed) Labs Reviewed  RESP PANEL BY RT-PCR (RSV, FLU A&B, COVID)  [id number]  PRO BRAIN NATRIURETIC PEPTIDE  COMPREHENSIVE METABOLIC PANEL WITH GFR  CBC WITH DIFFERENTIAL/PLATELET  TROPONIN T, HIGH SENSITIVITY    EKG: EKG Interpretation Date/Time:  Monday December 21 2024 00:15:21 EST Ventricular Rate:  81 PR Interval:  140 QRS Duration:  95 QT Interval:  394 QTC Calculation: 458 R Axis:   -6  Text Interpretation: Sinus rhythm Abnormal R-wave progression, early transition Left ventricular hypertrophy No significant change since 11/27/2024 Confirmed by Geroldine Berg ([id number]) on 12/21/2024 12:20:15 AM  Radiology: No results found.   Procedures   Medications Ordered in the ED  sodium chloride  0.9 % bolus 1,000 mL (has no administration in time range)  ipratropium-albuterol  (DUONEB) 0.5-2.5 (3) MG/3ML nebulizer solution 3 mL (has no administration in time range)                                    Medical Decision  Making Amount and/or Complexity of Data Reviewed Labs: ordered. Radiology: ordered.  Risk OTC drugs. Prescription drug management. Decision regarding hospitalization.   Patient is a 59 year old male presenting with episodic chest pain that has been occurring intermittently over the past month, but seems to be increasing in frequency.  Patient arrives here today with stable vital signs and is afebrile.  Physical examination is unremarkable.  Laboratory studies obtained including CBC, CMP, BNP, and troponin.  CBC is unremarkable, BMP shows a creatinine of 1.65, and BNP is 417.  Initial troponin was 113, then repeated and was 109.  CTA of the chest obtained showing no acute findings, but does show calcifications within coronary arteries, the LAD being the most prominent.  Patient to be started on a heparin  drip.  He was also given morphine  for pain and a DuoNeb breathing treatment.  Patient care discussed with Dr. Otelia from cardiology.  He has recommended starting heparin  until the patient is evaluated by cardiology.  He will be admitted to the hospitalist service with Dr. Franky accepting.  CRITICAL CARE Performed by: Berg Geroldine Total critical care time: 35 minutes Critical care time was exclusive of separately billable procedures and treating other patients. Critical care was necessary to treat or prevent imminent or life-threatening deterioration. Critical care was time spent personally by me on the following activities: development of treatment plan with patient and/or surrogate as well as nursing, discussions with consultants, evaluation of patient's response to treatment, examination of patient, obtaining history from patient or surrogate, ordering and performing treatments and interventions, ordering and review of laboratory studies, ordering and review of radiographic studies, pulse oximetry and re-evaluation of patient's condition.      Final diagnoses:  None    ED  Discharge Orders     None          Geroldine Berg, MD 12/21/24 [phone]  "

## 2024-12-21 NOTE — Consult Note (Addendum)
 "  Cardiology Consultation   Patient ID: Julian Myers MRN: [id number]; DOB: August 15, 1966  Admit date: 12/21/2024 Date of Consult: 12/21/2024  PCP:  [facility]   [facility] Providers Cardiologist:  Jomo Forand K Carmelita Amparo, MD        Patient Profile: Julian Myers is a 59 y.o. male with a history of hypertension, anxiety, and migraines who is being seen 12/21/2024 for the evaluation of chest pain at the request of Dr. Sherre.  History of Present Illness: Julian Myers is a 59 year old male with the above history.  No prior cardiac history.  Patient was recently seen in the ED on 11/25/2024 for further evaluation of left-sided chest pain with some tingling down his left arm.  EKG showed normal sinus rhythm with no acute ischemic changes.  High 60 troponin was minimally elevated and flat at 58 >> 56.  D-dimer was elevated but chest CTA was negative for PE.  Troponin elevation was felt to possibly due to his CKD.  He was felt to be stable for discharge and was referred to cardiology as an outpatient.  He was seen back in the ED on 11/27/2024 for the same symptoms.  EKG again showed no acute ischemic changes.  High-sensitivity troponin was stable at 48. However, it looks like he left before being seen.  Patient presented to the [facility] ED earlier this morning for ongoing chest pain. EKG showed normal sinus rhythm, rate 81 bpm, with LVH and no acute ischemic changes. High-sensitivity troponin 113 >> 109. Pro-BNP was 417.  Chest CTA was negative for PE but did show two-vessel coronary artery calcifications (greatest in the LAD). WBC 6.9, Hgb 14.7, Plts 169. Na 140, K 4.3, Glucose 116, BUN 21, Cr 1.65. Respiratory panel negative for COVID, influenza A/B, and RSV. He was started on IV Heparin  and Nitropaste with plans to admit to [facility]. Due to prolonged wait times, he was transferred to the [facility] ED.  Patient has been having progressive angina for about 1.5 months. He states he  first noticed it when he was having sex with his significant other. He developed chest pain and dyspnea with this and had associated lightheadedness, a flush sensation, and near syncope. He then started to notice similar symptoms while at work. 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 He described one episode while hunting in early 11/2024 where he actually fell to his knees climbing a hill because the pain was so bad. His symptoms have progressed and are now occurring with minimal activity like walking around the house and even at rest. He states symptom seem to be worse at night and can last for hours. The chest pain will radiated to his left arm. He also describes some possible orthopnea and PND lately. No edema. He has some palpitations with the chest pain and shortness of breath but nothing outside these times. No syncope. He states he had the flu a couple of weeks ago but has recovered from that . No other recent fevers or illnesses. No abnormal bleeding in urine or stools.  Currently, he is having left upper arm pain as well as a little left sided chest pain.   He smokes cigars a couple of times a weeks. He also reports he will typically have a couple of beers or mixed drinks about 3 nights per week.   He has a family history of heart disease. His maternal grandfather had a MI at an older age.  Past Medical History:  Diagnosis Date   Anxiety    Anxiety state 07/15/2019   Environmental allergies 07/15/2019   Essential hypertension 07/15/2019   History of migraine 07/15/2019   Hypertension     Past Surgical History:  Procedure Laterality Date   ANKLE SURGERY     BACK SURGERY     NECK SURGERY       Home Medications:  Prior to Admission medications  Medication Sig Start Date End Date Taking? Authorizing Provider  aspirin  325 MG tablet Take 325 mg by mouth daily.   Yes [provider]  amoxicillin -clavulanate (AUGMENTIN ) 875-125 MG tablet Take 1 tablet by mouth 2 (two) times  daily. Patient not taking: Reported on 12/21/2024 02/12/23   Sofia, Leslie K, PA-C  fluticasone  (FLONASE ) 50 MCG/ACT nasal spray 1 or 2 sprays each nostril twice a day Patient not taking: Reported on 12/21/2024 08/15/19   Jaycee Lenis, MD  SUMAtriptan  (IMITREX ) 50 MG tablet Take 1 tablet (50 mg total) by mouth once as needed for up to 1 dose for migraine. May repeat in 2 hours if headache persists or recurs. Patient not taking: Reported on 12/21/2024 07/14/19   Alexander, Natalie, DO    Scheduled Meds:  losartan   25 mg Oral Daily   Continuous Infusions:  heparin  1,400 Units/hr (12/21/24 0348)   PRN Meds: acetaminophen  **OR** acetaminophen , fluticasone , LORazepam , nitroGLYCERIN , ondansetron  **OR** ondansetron  (ZOFRAN ) IV, senna-docusate  Allergies:   Allergies[1]  Social History:   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: [employer]  Tobacco Use   Smoking status: Some Days    Types: Cigars   Smokeless tobacco: Never  Vaping Use   Vaping status: Never Used  Substance and Sexual Activity   Alcohol use: Yes    Comment: occ   Drug use: No   Sexual activity: Not on file  Other Topics Concern   Not on file  Social History Narrative   Not on file   Social Drivers of Health   Tobacco Use: High Risk (12/21/2024)   Patient History    Smoking Tobacco Use: Some Days    Smokeless Tobacco Use: Never    Passive Exposure: Not on file  Financial Resource Strain: Not on file  Food Insecurity: No Food Insecurity (12/21/2024)   Epic    Worried About Programme Researcher, Broadcasting/film/video in the Last Year: Never true    Ran Out of Food in the Last Year: Never true  Transportation Needs: No Transportation Needs (12/21/2024)   Epic    Lack of Transportation (Medical): No    Lack of  Transportation (Non-Medical): No  Physical Activity: Not on file  Stress: Not on file  Social Connections: Unknown (01/23/2023)   Received from [facility]   Social Network    Social Network: Not on file  Intimate Partner Violence: Not At Risk (12/21/2024)   Epic    Fear of Current or Ex-Partner: No    Emotionally Abused: No    Physically Abused: No    Sexually Abused: No  Depression (PHQ2-9): Not on file  Alcohol Screen: Not on file  Housing: Low Risk (12/21/2024)   Epic    Unable to Pay for Housing in the Last Year: No    Number of  Times Moved in the Last Year: 0    Homeless in the Last Year: No  Utilities: Not At Risk (12/21/2024)   Epic    Threatened with loss of utilities: No  Health Literacy: Not on file    Family History:   Family History  Problem Relation Age of Onset   Diabetes Mother    Hypertension Mother    Hypertension Father      ROS:  Please see the history of present illness.   Physical Exam/Data: Vitals:   12/21/24 0900 12/21/24 1130 12/21/24 1239 12/21/24 1330  BP:  136/84 (!) 153/81 (!) 149/85  Pulse: 93 82 73 68  Resp: (!) 23 13 20 15   Temp:   97.9 F (36.6 C) 97.9 F (36.6 C)  TempSrc:   Oral Oral  SpO2: 99% 95% 95% 95%  Weight:      Height:       No intake or output data in the 24 hours ending 12/21/24 1637    12/21/2024   12:13 AM 11/25/2024    7:54 AM 02/12/2023   10:04 AM  Last 3 Weights  Weight (lbs) 230 lb 230 lb 222 lb  Weight (kg) 104.327 kg 104.327 kg 100.699 kg     Body mass index is 30.34 kg/m.  General: 59 y.o. male resting comfortably in no acute distress. HEENT: Normocephalic and atraumatic. Sclera clear. EOMs intact. Neck: Supple. No carotid bruits. No JVD. Heart: RRR. Distinct S1 and S2. No murmurs, gallops, or rubs. Radial pulses 2+ and equal bilaterally. Lungs: No increased work of breathing. Clear to ausculation bilaterally. No wheezes, rhonchi, or rales.  Abdomen: Soft, non-distended, and non-tender to palpation.   Extremities: No lower extremity edema. Skin: Warm and dry. Neuro: Alert and oriented x3. No focal deficits. Psych: Normal affect. Responds appropriately.  EKG:  The EKG was personally reviewed and demonstrates:  Normal sinus rhythm, rate 81 bpm, with LVH and no acute ischemic changes. Telemetry:  Telemetry was personally reviewed and demonstrates:  Normal sinus rhythm with rates in the 70s to 80s.  Relevant CV Studies: N/A.  Laboratory Data: High Sensitivity Troponin:  No results for input(s): TROPONINIHS in the last 720 hours.  Recent Labs  Lab 11/25/24 0825 11/25/24 1026 11/27/24 2118 12/21/24 0100 12/21/24 0209  TRNPT 58* 56* 48* 113* 109*      Chemistry Recent Labs  Lab 12/21/24 0100  NA 140  K 4.3  CL 107  CO2 24  GLUCOSE 116*  BUN 21*  CREATININE 1.65*  CALCIUM  9.5  GFRNONAA 48*  ANIONGAP 9    Recent Labs  Lab 12/21/24 0100  PROT 6.4*  ALBUMIN 4.1  AST 21  ALT 29  ALKPHOS 68  BILITOT 0.4   Lipids No results for input(s): CHOL, TRIG, HDL, LABVLDL, LDLCALC, CHOLHDL in the last 168 hours.  Hematology Recent Labs  Lab 12/21/24 0100  WBC 6.9  RBC 4.71  HGB 14.7  HCT 41.8  MCV 88.7  MCH 31.2  MCHC 35.2  RDW 13.0  PLT 169   Thyroid No results for input(s): TSH, FREET4 in the last 168 hours.  BNP Recent Labs  Lab 12/21/24 0100  PROBNP 417.0*    DDimer No results for input(s): DDIMER in the last 168 hours.  Radiology/Studies:  CT Angio Chest PE W and/or Wo Contrast Result Date: 12/21/2024 EXAM: CTA CHEST 12/21/2024 02:20:00 AM TECHNIQUE: CTA of the chest was performed without and with the administration of 85 mL of iohexol  (OMNIPAQUE ) 350 MG/ML injection. Multiplanar  reformatted images are provided for review. MIP images are provided for review. Automated exposure control, iterative reconstruction, and/or weight based adjustment of the mA/kV was utilized to reduce the radiation dose to as low as reasonably achievable.  COMPARISON: CTA chest from 11/25/2024 and PA lateral chest 11/27/2024. CLINICAL HISTORY: Chest pain and shortness of breath ongoing for roughly 1 month, with radiation down the left upper extremity. Pulmonary embolism (PE) suspected, high prob. FINDINGS: PULMONARY ARTERIES: Pulmonary arteries are adequately opacified for evaluation. No acute pulmonary embolus. Main pulmonary artery is normal in caliber. MEDIASTINUM: The cardiac size is normal. There are patchy calcifications in the LAD coronary artery, minimal scattered calcification in the circumflex artery. Minimal pericardial effusion again noted anteriorly. There is trace calcified plaque in the distal aortic arch. The aorta and great vessels are otherwise normal. The pulmonary veins are nondistended. LYMPH NODES: No mediastinal, hilar or axillary lymphadenopathy. LUNGS AND PLEURA: The lungs are without acute process. No focal consolidation or pulmonary edema. No evidence of pleural effusion or pneumothorax. UPPER ABDOMEN: There is a small hiatal hernia. In the abdomen, the liver demonstrates mild to moderate steatosis. No acute upper abdominal abnormality. SOFT TISSUES AND BONES: Thoracic spondylosis and multilevel bridging enthesopathy. Small amount of subareolar dendritic gynecomastia of both breasts. IMPRESSION: 1. No evidence of pulmonary embolism. No acute chest CT or CTA findings. 2. Minimal pericardial effusion anteriorly. 3. 2 vessel Coronary artery calcifications, greatest in the LAD. 4. Small hiatal hernia. Electronically signed by: Francis Quam MD 12/21/2024 02:49 AM EST RP Workstation: [id number]     Assessment and Plan:  NSTEMI Patient has had progressive angina over the last 1.5 months with chest pain with radiation to left arm and dyspnea that initially was with exertion only and is now occurring at rest. EKG showed normal sinus rhythm with no acute ischemic changes. High-sensitivity troponin mildly elevated at 113 >> 109. Chest CTA  negative for PE. - He is currently having left arm pain and a little left sided chest pain.  - Echo pending. - Continue IV Heparin .  - Will start Aspirin  81mg  daily and Lipitor 80mg  daily.  - Will check lipid panel and hemoglobin A1c. - Will proceed with LHC today. The patient understands that risks include but are not limited to stroke (1 in 1000), death (1 in 1000), kidney failure [usually temporary] (1 in 500), bleeding (1 in 200), allergic reaction [possibly serious] (1 in 200), and agrees to proceed.   Hypertension BP mildly elevated. - Continue Losartan  25mg  daily. - Can continue to adjust medications after cardiac catheterization.  CKD Stage III Baseline around creatinine around 1.4 to 1.7.  - Creatinine stable at 1.65.  - Continue to monitor.  Risk Assessment/Risk Scores: TIMI Risk Score for Unstable Angina or Non-ST Elevation MI:   The patient's TIMI risk score is 3, which indicates a 13% risk of all cause mortality, new or recurrent myocardial infarction or need for urgent revascularization in the next 14 days.{   For questions or updates, please contact [facility] Please consult [email or web address] for contact info under      Signed, Callie E Goodrich, PA-C  12/21/2024 4:37 PM  ATTENDING ATTESTATION:  After conducting a review of all available clinical information with the care team, interviewing the patient, and performing a physical exam, I agree with the findings and plan described in this note with adjustments as indicated below which were discussed and enacted by staff above.   GEN: Mildly uncomfortable, AO x 3 HEENT:  MMM,  no JVD, no scleral icterus Cardiac: RRR, no murmurs, rubs, or gallops.  Respiratory: Clear to auscultation bilaterally. GI: Soft, nontender, non-distended  MS: No edema; No deformity. Neuro:  Nonfocal  Vasc:  +2 radial pulses  Patient is a 59 year old male with a history of hypertension, anxiety, migraines, and CKD stage III who is  here with unstable angina.  The patient is 7 been experiencing crescendo angina over the last few weeks.  Because of worsening symptoms that occurred basically at rest he presented to the emergency department.  His EKG here demonstrates no ischemic changes.  Despite a heparin  infusion, relatively normal tension, and not being tachycardic with a heart rate in the 80s the patient continues to have angina about 4 out of 10 severity.  I recommend we proceed to urgent coronary angiography today.  I suspect he likely has a left circumflex lesion.  He has a +2 right radial pulse and no contraindications to DAPT.  I have reviewed the risks, indications, and alternatives to cardiac catheterization, possible angioplasty, and stenting with the patient. Risks include but are not limited to bleeding, infection, vascular injury, stroke, myocardial infection, arrhythmia, kidney injury, radiation-related injury in the case of prolonged fluoroscopy use, emergency cardiac surgery, and death. The patient understands the risks of serious complication is 1-2 in 1000 with diagnostic cardiac cath and 1-2% or less with angioplasty/stenting.    Deran Barro, MD Pager [phone]      [1] No Known Allergies  "

## 2024-12-21 NOTE — ED Notes (Signed)
 Called [facility] for transport to [facility] ED @12 :56.   Spoke with Nataya

## 2024-12-22 ENCOUNTER — Other Ambulatory Visit (HOSPITAL_COMMUNITY): Payer: Self-pay

## 2024-12-22 ENCOUNTER — Inpatient Hospital Stay (HOSPITAL_COMMUNITY): Payer: MEDICAID

## 2024-12-22 DIAGNOSIS — I214 Non-ST elevation (NSTEMI) myocardial infarction: Secondary | ICD-10-CM

## 2024-12-22 DIAGNOSIS — I200 Unstable angina: Secondary | ICD-10-CM

## 2024-12-22 LAB — CBC
HCT: 46.1 % (ref 39.0–52.0)
Hemoglobin: 16.2 g/dL (ref 13.0–17.0)
MCH: 31.4 pg (ref 26.0–34.0)
MCHC: 35.1 g/dL (ref 30.0–36.0)
MCV: 89.3 fL (ref 80.0–100.0)
Platelets: 197 K/uL (ref 150–400)
RBC: 5.16 MIL/uL (ref 4.22–5.81)
RDW: 13.6 % (ref 11.5–15.5)
WBC: 7.8 K/uL (ref 4.0–10.5)
nRBC: 0 % (ref 0.0–0.2)

## 2024-12-22 LAB — BASIC METABOLIC PANEL WITH GFR
Anion gap: 14 (ref 5–15)
BUN: 16 mg/dL (ref 6–20)
CO2: 22 mmol/L (ref 22–32)
Calcium: 9.6 mg/dL (ref 8.9–10.3)
Chloride: 104 mmol/L (ref 98–111)
Creatinine, Ser: 1.6 mg/dL — ABNORMAL HIGH (ref 0.61–1.24)
GFR, Estimated: 50 mL/min — ABNORMAL LOW
Glucose, Bld: 88 mg/dL (ref 70–99)
Potassium: 4.2 mmol/L (ref 3.5–5.1)
Sodium: 140 mmol/L (ref 135–145)

## 2024-12-22 LAB — ECHOCARDIOGRAM COMPLETE
Area-P 1/2: 2.55 cm2
Height: 73 in
P 1/2 time: 85 ms
S' Lateral: 3.4 cm
Weight: 3680 [oz_av]

## 2024-12-22 LAB — LIPID PANEL
Cholesterol: 175 mg/dL (ref 0–200)
HDL: 31 mg/dL — ABNORMAL LOW
LDL Cholesterol: 98 mg/dL (ref 0–99)
Total CHOL/HDL Ratio: 5.7 ratio
Triglycerides: 231 mg/dL — ABNORMAL HIGH
VLDL: 46 mg/dL — ABNORMAL HIGH (ref 0–40)

## 2024-12-22 MED ORDER — ATORVASTATIN CALCIUM 80 MG PO TABS
80.0000 mg | ORAL_TABLET | Freq: Every day | ORAL | 0 refills | Status: AC
  Filled 2024-12-22: qty 30, 30d supply, fill #0

## 2024-12-22 MED ORDER — METOPROLOL SUCCINATE ER 25 MG PO TB24
12.5000 mg | ORAL_TABLET | Freq: Every day | ORAL | Status: DC
  Administered 2024-12-22: 12.5 mg via ORAL
  Filled 2024-12-22: qty 1

## 2024-12-22 MED ORDER — LOSARTAN POTASSIUM 25 MG PO TABS
25.0000 mg | ORAL_TABLET | Freq: Every day | ORAL | 0 refills | Status: DC
  Filled 2024-12-22: qty 30, 30d supply, fill #0

## 2024-12-22 MED ORDER — PERFLUTREN LIPID MICROSPHERE
1.0000 mL | INTRAVENOUS | Status: AC | PRN
  Administered 2024-12-22: 3 mL via INTRAVENOUS

## 2024-12-22 MED ORDER — METOPROLOL SUCCINATE ER 25 MG PO TB24
12.5000 mg | ORAL_TABLET | Freq: Every day | ORAL | 0 refills | Status: DC
  Filled 2024-12-22: qty 15, 30d supply, fill #0

## 2024-12-22 MED ORDER — TICAGRELOR 90 MG PO TABS
90.0000 mg | ORAL_TABLET | Freq: Two times a day (BID) | ORAL | 0 refills | Status: AC
  Filled 2024-12-22: qty 60, 30d supply, fill #0

## 2024-12-22 MED ORDER — NITROGLYCERIN 0.4 MG SL SUBL: 0.4000 mg | SUBLINGUAL_TABLET | SUBLINGUAL | Status: DC | PRN

## 2024-12-22 MED ORDER — AMLODIPINE BESYLATE 5 MG PO TABS
5.0000 mg | ORAL_TABLET | Freq: Every day | ORAL | Status: DC
  Administered 2024-12-22 – 2024-12-23 (×2): 5 mg via ORAL
  Filled 2024-12-22 (×2): qty 1

## 2024-12-22 MED ORDER — AMLODIPINE BESYLATE 5 MG PO TABS
5.0000 mg | ORAL_TABLET | Freq: Every day | ORAL | 0 refills | Status: AC
  Filled 2024-12-22: qty 90, 90d supply, fill #0

## 2024-12-22 MED ORDER — LOSARTAN POTASSIUM 25 MG PO TABS: 25.0000 mg | ORAL_TABLET | Freq: Every day | ORAL | Status: DC

## 2024-12-22 MED ORDER — ASPIRIN 81 MG PO TBEC
81.0000 mg | DELAYED_RELEASE_TABLET | Freq: Every day | ORAL | 0 refills | Status: AC
  Filled 2024-12-22: qty 90, 90d supply, fill #0

## 2024-12-22 NOTE — Progress Notes (Signed)
" °   12/22/24 0946  Vitals  Pulse Rate 71  ECG Heart Rate 72  MEWS COLOR  MEWS Score Color Green  Orthostatic Lying   BP- Lying 151/82  Pulse- Lying 71  Orthostatic Sitting  BP- Sitting 148/84  Pulse- Sitting 85  Orthostatic Standing at 0 minutes  BP- Standing at 0 minutes 150/90  Pulse- Standing at 0 minutes 95  Oxygen Therapy  SpO2 96 %  MEWS Score  MEWS Temp 0  MEWS Systolic 0  MEWS Pulse 0  MEWS RR 0  MEWS LOC 0  MEWS Score 0    "

## 2024-12-22 NOTE — Discharge Instructions (Signed)
Post NSTEMI: NO HEAVY LIFTING X 2 WEEKS. NO SEXUAL ACTIVITY X 2 WEEKS. NO DRIVING X 1 WEEK. NO SOAKING BATHS, HOT TUBS, POOLS, ETC., X 7 DAYS.  Radial Site Care: Refer to this sheet in the next few weeks. These instructions provide you with information on caring for yourself after your procedure. Your caregiver may also give you more specific instructions. Your treatment has been planned according to current medical practices, but problems sometimes occur. Call your caregiver if you have any problems or questions after your procedure. HOME CARE INSTRUCTIONS  You may shower the day after the procedure.Remove the bandage (dressing) and gently wash the site with plain soap and water.Gently pat the site dry.   Do not apply powder or lotion to the site.   Do not submerge the affected site in water for 3 to 5 days.   Inspect the site at least twice daily.   Do not flex or bend the affected arm for 24 hours.   No lifting over 5 pounds (2.3 kg) for 5 days after your procedure.   Do not drive home if you are discharged the same day of the procedure. Have someone else drive you.  What to expect:  Any bruising will usually fade within 1 to 2 weeks.   Blood that collects in the tissue (hematoma) may be painful to the touch. It should usually decrease in size and tenderness within 1 to 2 weeks.  SEEK IMMEDIATE MEDICAL CARE IF:  You have unusual pain at the radial site.   You have redness, warmth, swelling, or pain at the radial site.   You have drainage (other than a small amount of blood on the dressing).   You have chills.   You have a fever or persistent symptoms for more than 72 hours.   You have a fever and your symptoms suddenly get worse.   Your arm becomes pale, cool, tingly, or numb.   You have heavy bleeding from the site. Hold pressure on the site.

## 2024-12-22 NOTE — Progress Notes (Addendum)
 "  Rounding Note   Patient Name: Julian Myers Date of Encounter: 12/22/2024  [facility] Cardiologist: Caelen Reierson K Noemi Bellissimo, MD   Subjective No acute overnight events. He is feeling better this morning. He reports some vague soreness in his chest but no real chest pain/ left arm pain like what he was having prior to PCI. He also feels like his breathing is shallow.  Scheduled Meds:  amLODipine   5 mg Oral Daily   aspirin  EC  81 mg Oral Daily   atorvastatin   80 mg Oral Daily   enoxaparin  (LOVENOX ) injection  40 mg Subcutaneous Q24H   losartan   25 mg Oral Daily   sodium chloride  flush  3 mL Intravenous Q12H   ticagrelor   90 mg Oral Q12H   Continuous Infusions:  sodium chloride      PRN Meds: sodium chloride , acetaminophen  **OR** acetaminophen , fluticasone , LORazepam , nitroGLYCERIN , ondansetron  **OR** ondansetron  (ZOFRAN ) IV, mouth rinse, oxyCODONE , senna-docusate, sodium chloride  flush   Vital Signs  Vitals:   12/21/24 2235 12/21/24 2316 12/22/24 0454 12/22/24 0856  BP: (!) 149/90 (!) 146/80  (!) 146/93  Pulse: 82 84 76 85  Resp:  18 18 18   Temp:  98.1 F (36.7 C) 98.3 F (36.8 C)   TempSrc:  Oral Oral   SpO2: 96% 96% 97%   Weight:      Height:        Intake/Output Summary (Last 24 hours) at 12/22/2024 0858 Last data filed at 12/21/2024 2000 Gross per 24 hour  Intake 860 ml  Output --  Net 860 ml      12/21/2024   12:13 AM 11/25/2024    7:54 AM 02/12/2023   10:04 AM  Last 3 Weights  Weight (lbs) 230 lb 230 lb 222 lb  Weight (kg) 104.327 kg 104.327 kg 100.699 kg      Telemetry Normal sinus rhythm rhythm with rates in the 70s to 80s. - Personally Reviewed  ECG  Normal sinus rhythm, rate 70 bpm, with non-specific T wave changes in inferior leads. - Personally Reviewed  Physical Exam  GEN: No acute distress.   Neck: No JVD. Cardiac: RRR. No murmurs, rubs, or gallops.  Respiratory: Clear to auscultation bilaterally. No wheezes, rhonchi, or rales. MS: No  edema extremity edema. Right radial cath site soft with no signs of hematoma. Neuro:  No focal deficits.  Psych: Normal affect. Responds appropriately.  Labs High Sensitivity Troponin:  No results for input(s): TROPONINIHS in the last 720 hours.  Recent Labs  Lab 11/25/24 0825 11/25/24 1026 11/27/24 2118 12/21/24 0100 12/21/24 0209  TRNPT 58* 56* 48* 113* 109*       Chemistry Recent Labs  Lab 12/21/24 0100 12/21/24 1913 12/22/24 0419  NA 140  --  140  K 4.3  --  4.2  CL 107  --  104  CO2 24  --  22  GLUCOSE 116*  --  88  BUN 21*  --  16  CREATININE 1.65* 1.41* 1.60*  CALCIUM  9.5  --  9.6  PROT 6.4*  --   --   ALBUMIN 4.1  --   --   AST 21  --   --   ALT 29  --   --   ALKPHOS 68  --   --   BILITOT 0.4  --   --   GFRNONAA 48* 58* 50*  ANIONGAP 9  --  14    Lipids  Recent Labs  Lab 12/22/24 0419  CHOL 175  TRIG 231*  HDL 31*  LDLCALC 98  CHOLHDL 5.7    Hematology Recent Labs  Lab 12/21/24 0100 12/21/24 1913 12/22/24 0419  WBC 6.9 8.0 7.8  RBC 4.71 5.18 5.16  HGB 14.7 16.3 16.2  HCT 41.8 46.1 46.1  MCV 88.7 89.0 89.3  MCH 31.2 31.5 31.4  MCHC 35.2 35.4 35.1  RDW 13.0 13.3 13.6  PLT 169 190 197   Thyroid No results for input(s): TSH, FREET4 in the last 168 hours.  BNP Recent Labs  Lab 12/21/24 0100  PROBNP 417.0*    DDimer No results for input(s): DDIMER in the last 168 hours.   Radiology  CARDIAC CATHETERIZATION Result Date: 12/22/2024   Prox LAD lesion is 50% stenosed.   Dist LAD lesion is 80% stenosed.   Mid Cx lesion is 95% stenosed.   A drug-eluting stent was successfully placed using a STENT SYNERGY XD 4.0X16.   Post intervention, there is a 0% residual stenosis.   LV end diastolic pressure is normal.   In the absence of any other complications or medical issues, we expect the patient to be ready for discharge from an interventional cardiology perspective on 12/22/2024.   Recommend uninterrupted dual antiplatelet therapy with Aspirin   81mg  daily and Ticagrelor  90mg  twice daily for a minimum of 12 months (ACS-Class I recommendation). 2 vessel obstructive CAD. Culprit lesion in the LCx which is a large vessel. Normal LVEDP Successful PCI of the LCx with DES Plan: DAPT for one year. Will treat residual disease in the LAD medically. Assess LV function by Echo. Anticipate DC in am. Risk factor modification.   CT Angio Chest PE W and/or Wo Contrast Result Date: 12/21/2024 EXAM: CTA CHEST 12/21/2024 02:20:00 AM TECHNIQUE: CTA of the chest was performed without and with the administration of 85 mL of iohexol  (OMNIPAQUE ) 350 MG/ML injection. Multiplanar reformatted images are provided for review. MIP images are provided for review. Automated exposure control, iterative reconstruction, and/or weight based adjustment of the mA/kV was utilized to reduce the radiation dose to as low as reasonably achievable. COMPARISON: CTA chest from 11/25/2024 and PA lateral chest 11/27/2024. CLINICAL HISTORY: Chest pain and shortness of breath ongoing for roughly 1 month, with radiation down the left upper extremity. Pulmonary embolism (PE) suspected, high prob. FINDINGS: PULMONARY ARTERIES: Pulmonary arteries are adequately opacified for evaluation. No acute pulmonary embolus. Main pulmonary artery is normal in caliber. MEDIASTINUM: The cardiac size is normal. There are patchy calcifications in the LAD coronary artery, minimal scattered calcification in the circumflex artery. Minimal pericardial effusion again noted anteriorly. There is trace calcified plaque in the distal aortic arch. The aorta and great vessels are otherwise normal. The pulmonary veins are nondistended. LYMPH NODES: No mediastinal, hilar or axillary lymphadenopathy. LUNGS AND PLEURA: The lungs are without acute process. No focal consolidation or pulmonary edema. No evidence of pleural effusion or pneumothorax. UPPER ABDOMEN: There is a small hiatal hernia. In the abdomen, the liver demonstrates mild  to moderate steatosis. No acute upper abdominal abnormality. SOFT TISSUES AND BONES: Thoracic spondylosis and multilevel bridging enthesopathy. Small amount of subareolar dendritic gynecomastia of both breasts. IMPRESSION: 1. No evidence of pulmonary embolism. No acute chest CT or CTA findings. 2. Minimal pericardial effusion anteriorly. 3. 2 vessel Coronary artery calcifications, greatest in the LAD. 4. Small hiatal hernia. Electronically signed by: Francis Quam MD 12/21/2024 02:49 AM EST RP Workstation: [id number]    Cardiac Studies  Left Cardiac Catheterization 12/21/2024:   Prox LAD lesion is 50% stenosed.  Dist LAD lesion is 80% stenosed.   Mid Cx lesion is 95% stenosed.   A drug-eluting stent was successfully placed using a STENT SYNERGY XD 4.0X16.   Post intervention, there is a 0% residual stenosis.   LV end diastolic pressure is normal.   In the absence of any other complications or medical issues, we expect the patient to be ready for discharge from an interventional cardiology perspective on 12/22/2024.   Recommend uninterrupted dual antiplatelet therapy with Aspirin  81mg  daily and Ticagrelor  90mg  twice daily for a minimum of 12 months (ACS-Class I recommendation).   2 vessel obstructive CAD. Culprit lesion in the LCx which is a large vessel.  Normal LVEDP Successful PCI of the LCx with DES   Plan: DAPT for one year. Will treat residual disease in the LAD medically. Assess LV function by Echo. Anticipate DC in am. Risk factor modification.    Patient Profile   59 y.o. male with a history of hypertension, anxiety, and migraines who was admitted on 12/22/2023 for NSTEMI after presenting with accelerating angina over the last 1.5 months.   Assessment & Plan   NSTEMI Patient has had progressive angina over the last 1.5 months with chest pain with radiation to left arm and dyspnea that initially was with exertion only and is now occurring at rest. EKG showed normal sinus rhythm with  no acute ischemic changes. High-sensitivity troponin mildly elevated at 113 >> 109. Chest CTA negative for PE. LHC showed 95% stenosis of mid LCX, 50% stenosis of proximal LAD, and 80% stenosis of distal LAD. He underwent successful PCI with DES to LCX. Residual LAD disease was treated medically. - He reports some vague chest soreness but no real chest pain/ left arm pain like what he was having prior to PCI. - Echo pending. - Will resume home Amlodipine  5mg  daily. - Continue DAPT with Aspirin  81mg  daily and Brilinta  90mg  twice daily for a minimum of 12 months. - Continue Lipitor 80mg  daily.   Hypertension BP mildly elevated. - Continue Losartan  25mg  daily. He states he was previously on this but had not taken it in a month after running out.  - Will restart Amlodipine  5mg  daily.   Hyperlipidemia Lipid panel this admission: Total Cholesterol 175, Triglycerides 231, HDL 31, LDL 98.  - Started on Lipitor 80mg  daily this admission. Continue.  - Will need repeat lipid panel and LFTs in 6-8 weeks.  Prediabetes Hemoglobin A1c 5.7%.    CKD Stage III Baseline around creatinine around 1.4 to 1.7.  - Creatinine stable at 1.60.  For questions or updates, please contact [facility] Please consult [email or web address] for contact info under    Signed, Callie E Goodrich, PA-C  12/22/2024, 8:58 AM    ATTENDING ATTESTATION:  After conducting a review of all available clinical information with the care team, interviewing the patient, and performing a physical exam, I agree with the findings and plan described in this note with adjustments as indicated below which were discussed and enacted by staff above.   GEN: No acute distress, AO x 3 HEENT:  MMM, no JVD, no scleral icterus Cardiac: RRR, no murmurs, rubs, or gallops.  Respiratory: Clear to auscultation bilaterally. GI: Soft, nontender, non-distended  MS: No edema; No deformity. Neuro:  Nonfocal  Vasc:  +2 radial pulses  S/p PCI high  grade Lcx yesterday without issues.  This AM has had some dizziness but feels better after eating breakfast.  ACS/UA S/p PCI Lcx with residual moderate LAD disease Continue  ASA 81mg , ticagrelor  90mg  BID x 1 year followed by indefinite ticagrelor  monotherapy Continue lipitor 80mg  PRN NTG Start Toprol  12.5mg  QPM given moderate residual LAD disease Medical therapy for now F/u TTE today HL Continue atorvastatin  80mg  HTN Continue losartan  25mg  Restart amlodipine  5mg  CKD IIIa Continue losartan  Consider SLGT2i in future Dispo If TTE ok and patient ambulating, will consider d/c today.  Darlette Dubow, MD Pager [phone]   "

## 2024-12-22 NOTE — Progress Notes (Addendum)
 "  [facility] PROGRESS NOTE   Alejos Reinhardt FMW:[id number] DOB: 05/24/1966 DOA: 12/21/2024  [facility]P: [facility]  Brief History: 59 year old male with history of CKD stage IIIa/2, occasional cigar smoking, occasional EtOH use, overweight, migraine headaches, hypertension.  Patient presented with chest pain and shortness of breath.  Was found to have elevated troponin levels.  CT angiogram was negative for PE.  Concern was for coronary artery disease/NSTEMI.  Patient was hospitalized for further management.  Consultants: Cardiology  Procedures:  Cardiac catheterization   Prox LAD lesion is 50% stenosed.   Dist LAD lesion is 80% stenosed.   Mid Cx lesion is 95% stenosed.   A drug-eluting stent was successfully placed using a STENT SYNERGY XD 4.0X16.   Post intervention, there is a 0% residual stenosis.   LV end diastolic pressure is normal.   In the absence of any other complications or medical issues, we expect the patient to be ready for discharge from an interventional cardiology perspective on 12/22/2024.   Recommend uninterrupted dual antiplatelet therapy with Aspirin  81mg  daily and Ticagrelor  90mg  twice daily for a minimum of 12 months (ACS-Class I recommendation).  2 vessel obstructive CAD. Culprit lesion in the LCx which is a large vessel.  Normal LVEDP Successful PCI of the LCx with DES Plan: DAPT for one year. Will treat residual disease in the LAD medically. Assess LV function by Echo. Anticipate DC in am. Risk factor modification.     Subjective/Interval History: Patient states that he is feeling well.  Denies any issues overnight.  Denies any chest pain shortness of breath.  When cardiac rehab went to work with him he mentioned that he was feeling a bit dizzy.  Vital signs were checked at that time.  Cardiology has also seen the patient.  Echocardiogram is pending.    Assessment/Plan:  NSTEMI Patient with elevated troponin and chest pain.  EKG did not show  any ischemic changes however CT angiogram which was negative for PE raised concern for coronary artery disease.  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  Underwent PCI and stenting of the left circumflex.  LAD disease to be managed medically. Patient is on aspirin  and Brilinta .  Patient is on statin.  LDL noted to be 98.  Triglycerides 231. Amlodipine  initiated by cardiology today. Echocardiogram is pending.  Essential hypertension Losartan  is being continued.  Chronic kidney disease stage IIIa Creatinine close to baseline.  Monitor urine output.  Avoid nephrotoxic agents.  History of migraine headaches Stable.  Obesity Estimated body mass index is 30.34 kg/m as calculated from the following:   Height as of this encounter: 6' 1 (1.854 m).   Weight as of this encounter: 104.3 kg.  DVT Prophylaxis: Lovenox  Code Status: Full code Family Communication: Discussed with patient Disposition Plan: Home when cleared by cardiology    Medications: Scheduled:  amLODipine   5 mg Oral Daily   aspirin  EC  81 mg Oral Daily   atorvastatin   80 mg Oral Daily   enoxaparin  (LOVENOX ) injection  40 mg Subcutaneous Q24H   losartan   25 mg Oral Daily   sodium chloride  flush  3 mL Intravenous Q12H   ticagrelor   90 mg Oral Q12H   Continuous:  sodium chloride      PRN:sodium chloride , acetaminophen  **OR** acetaminophen , fluticasone , LORazepam , nitroGLYCERIN , ondansetron  **OR** ondansetron  (ZOFRAN ) IV, mouth rinse, oxyCODONE , senna-docusate, sodium chloride  flush   Objective:  Vital Signs  Vitals:   12/21/24 2316 12/22/24 0454 12/22/24 0856 12/22/24 9053  BP: (!) 146/80  (!) 146/93   Pulse: 84 76 85 71  Resp: 18 18 18    Temp: 98.1 F (36.7 C) 98.3 F (36.8 C) 98.3 F (36.8 C)   TempSrc: Oral Oral Oral   SpO2: 96% 97%  96%  Weight:      Height:        Intake/Output Summary (Last 24 hours) at 12/22/2024 1027 Last data filed at 12/22/2024 0915 Gross per 24  hour  Intake 1100 ml  Output 0 ml  Net 1100 ml   Filed Weights   12/21/24 0013  Weight: 104.3 kg    General appearance: Awake alert.  In no distress Resp: Clear to auscultation bilaterally.  Normal effort Cardio: S1-S2 is normal regular.  No S3-S4.  No rubs murmurs or bruit GI: Abdomen is soft.  Nontender nondistended.  Bowel sounds are present normal.  No masses organomegaly Extremities: No edema.  Full range of motion of lower extremities. Neurologic: Alert and oriented x3.  No focal neurological deficits.    Lab Results:  Data Reviewed: I have personally reviewed following labs and reports of the imaging studies  CBC: Recent Labs  Lab 12/21/24 0100 12/21/24 1913 12/22/24 0419  WBC 6.9 8.0 7.8  NEUTROABS 3.5  --   --   HGB 14.7 16.3 16.2  HCT 41.8 46.1 46.1  MCV 88.7 89.0 89.3  PLT 169 190 197    Basic Metabolic Panel: Recent Labs  Lab 12/21/24 0100 12/21/24 1913 12/22/24 0419  NA 140  --  140  K 4.3  --  4.2  CL 107  --  104  CO2 24  --  22  GLUCOSE 116*  --  88  BUN 21*  --  16  CREATININE 1.65* 1.41* 1.60*  CALCIUM  9.5  --  9.6    GFR: Estimated Creatinine Clearance: 64.6 mL/min (A) (by C-G formula based on SCr of 1.6 mg/dL (H)).  Liver Function Tests: Recent Labs  Lab 12/21/24 0100  AST 21  ALT 29  ALKPHOS 68  BILITOT 0.4  PROT 6.4*  ALBUMIN 4.1   BNP (last 3 results) Recent Labs    12/21/24 0100  PROBNP 417.0*    HbA1C: Recent Labs    12/21/24 1913  HGBA1C 5.7*   Lipid Profile: Recent Labs    12/22/24 0419  CHOL 175  HDL 31*  LDLCALC 98  TRIG 768*  CHOLHDL 5.7    Recent Results (from the past 240 hours)  Resp panel by RT-PCR (RSV, Flu A&B, Covid) Anterior Nasal Swab     Status: None   Collection Time: 12/21/24  1:00 AM   Specimen: Anterior Nasal Swab  Result Value Ref Range Status   SARS Coronavirus 2 by RT PCR NEGATIVE NEGATIVE Final    Comment: (NOTE) SARS-CoV-2 target nucleic acids are NOT DETECTED.  The  SARS-CoV-2 RNA is generally detectable in upper respiratory specimens during the acute phase of infection. The lowest concentration of SARS-CoV-2 viral copies this assay can detect is 138 copies/mL. A negative result does not preclude SARS-Cov-2 infection and should not be used as the sole basis for treatment or other patient management decisions. A negative result may occur with  improper specimen collection/handling, submission of specimen other than nasopharyngeal swab, presence of viral mutation(s) within the areas targeted by this assay, and inadequate number of viral copies(<138 copies/mL). A negative result must be combined with clinical observations, patient history, and epidemiological information. The expected result is Negative.  Fact Sheet for  Patients:  bloggercourse.com  Fact Sheet for Healthcare Providers:  seriousbroker.it  This test is no t yet approved or cleared by the [place]  FDA and  has been authorized for detection and/or diagnosis of SARS-CoV-2 by FDA under an Emergency Use Authorization (EUA). This EUA will remain  in effect (meaning this test can be used) for the duration of the COVID-19 declaration under Section 564(b)(1) of the Act, 21 U.S.C.section 360bbb-3(b)(1), unless the authorization is terminated  or revoked sooner.       Influenza A by PCR NEGATIVE NEGATIVE Final   Influenza B by PCR NEGATIVE NEGATIVE Final    Comment: (NOTE) The Xpert Xpress SARS-CoV-2/FLU/RSV plus assay is intended as an aid in the diagnosis of influenza from Nasopharyngeal swab specimens and should not be used as a sole basis for treatment. Nasal washings and aspirates are unacceptable for Xpert Xpress SARS-CoV-2/FLU/RSV testing.  Fact Sheet for Patients: bloggercourse.com  Fact Sheet for Healthcare Providers: seriousbroker.it  This test is not yet approved or  cleared by the [place]  FDA and has been authorized for detection and/or diagnosis of SARS-CoV-2 by FDA under an Emergency Use Authorization (EUA). This EUA will remain in effect (meaning this test can be used) for the duration of the COVID-19 declaration under Section 564(b)(1) of the Act, 21 U.S.C. section 360bbb-3(b)(1), unless the authorization is terminated or revoked.     Resp Syncytial Virus by PCR NEGATIVE NEGATIVE Final    Comment: (NOTE) Fact Sheet for Patients: bloggercourse.com  Fact Sheet for Healthcare Providers: seriousbroker.it  This test is not yet approved or cleared by the [place]  FDA and has been authorized for detection and/or diagnosis of SARS-CoV-2 by FDA under an Emergency Use Authorization (EUA). This EUA will remain in effect (meaning this test can be used) for the duration of the COVID-19 declaration under Section 564(b)(1) of the Act, 21 U.S.C. section 360bbb-3(b)(1), unless the authorization is terminated or revoked.  Performed at [facility], [address], [place], [place] [address]       Radiology Studies: CARDIAC CATHETERIZATION Result Date: 12/22/2024   Prox LAD lesion is 50% stenosed.   Dist LAD lesion is 80% stenosed.   Mid Cx lesion is 95% stenosed.   A drug-eluting stent was successfully placed using a STENT SYNERGY XD 4.0X16.   Post intervention, there is a 0% residual stenosis.   LV end diastolic pressure is normal.   In the absence of any other complications or medical issues, we expect the patient to be ready for discharge from an interventional cardiology perspective on 12/22/2024.   Recommend uninterrupted dual antiplatelet therapy with Aspirin  81mg  daily and Ticagrelor  90mg  twice daily for a minimum of 12 months (ACS-Class I recommendation). 2 vessel obstructive CAD. Culprit lesion in the LCx which is a large vessel. Normal LVEDP Successful PCI of the LCx with DES  Plan: DAPT for one year. Will treat residual disease in the LAD medically. Assess LV function by Echo. Anticipate DC in am. Risk factor modification.   CT Angio Chest PE W and/or Wo Contrast Result Date: 12/21/2024 EXAM: CTA CHEST 12/21/2024 02:20:00 AM TECHNIQUE: CTA of the chest was performed without and with the administration of 85 mL of iohexol  (OMNIPAQUE ) 350 MG/ML injection. Multiplanar reformatted images are provided for review. MIP images are provided for review. Automated exposure control, iterative reconstruction, and/or weight based adjustment of the mA/kV was utilized to reduce the radiation dose to as low as reasonably achievable. COMPARISON: CTA chest from 11/25/2024  and PA lateral chest 11/27/2024. CLINICAL HISTORY: Chest pain and shortness of breath ongoing for roughly 1 month, with radiation down the left upper extremity. Pulmonary embolism (PE) suspected, high prob. FINDINGS: PULMONARY ARTERIES: Pulmonary arteries are adequately opacified for evaluation. No acute pulmonary embolus. Main pulmonary artery is normal in caliber. MEDIASTINUM: The cardiac size is normal. There are patchy calcifications in the LAD coronary artery, minimal scattered calcification in the circumflex artery. Minimal pericardial effusion again noted anteriorly. There is trace calcified plaque in the distal aortic arch. The aorta and great vessels are otherwise normal. The pulmonary veins are nondistended. LYMPH NODES: No mediastinal, hilar or axillary lymphadenopathy. LUNGS AND PLEURA: The lungs are without acute process. No focal consolidation or pulmonary edema. No evidence of pleural effusion or pneumothorax. UPPER ABDOMEN: There is a small hiatal hernia. In the abdomen, the liver demonstrates mild to moderate steatosis. No acute upper abdominal abnormality. SOFT TISSUES AND BONES: Thoracic spondylosis and multilevel bridging enthesopathy. Small amount of subareolar dendritic gynecomastia of both breasts. IMPRESSION: 1.  No evidence of pulmonary embolism. No acute chest CT or CTA findings. 2. Minimal pericardial effusion anteriorly. 3. 2 vessel Coronary artery calcifications, greatest in the LAD. 4. Small hiatal hernia. Electronically signed by: Francis Quam MD 12/21/2024 02:49 AM EST RP Workstation: [id number]       LOS: 1 day   Joette Pebbles  [facility] Pager on [email or web address]  12/22/2024, 10:27 AM   "

## 2024-12-22 NOTE — Plan of Care (Signed)

## 2024-12-22 NOTE — Plan of Care (Signed)
" °  Problem: Clinical Measurements: Goal: Respiratory complications will improve Outcome: Progressing Goal: Cardiovascular complication will be avoided Outcome: Progressing   Problem: Activity: Goal: Risk for activity intolerance will decrease Outcome: Progressing   Problem: Nutrition: Goal: Adequate nutrition will be maintained Outcome: Progressing   Problem: Elimination: Goal: Will not experience complications related to urinary retention Outcome: Progressing   Problem: Safety: Goal: Ability to remain free from injury will improve Outcome: Progressing   Problem: Activity: Goal: Ability to return to baseline activity level will improve Outcome: Progressing   Problem: Cardiovascular: Goal: Ability to achieve and maintain adequate cardiovascular perfusion will improve Outcome: Progressing Goal: Vascular access site(s) Level 0-1 will be maintained Outcome: Progressing   "

## 2024-12-22 NOTE — Progress Notes (Signed)
 Echocardiogram 2D Echocardiogram has been performed.  Julian Myers 12/22/2024, 11:54 AM

## 2024-12-22 NOTE — Progress Notes (Signed)
 Echo attempted, RN states to come back after pt finishes eating   [facility]

## 2024-12-22 NOTE — Progress Notes (Signed)
 Upon arrival pt sitting up on EOB however voiced he felt weak and dizzy. Ordered breakfast and pt laid back down. Initially felt better lying. BP down from 146 to 128 systolic. Pt sat up again a few minutes later and BP 155/91. Pt voices he feels weak, dizzy, and can't breathe deeply. Sts this really started last night. ? If Brilinta  response versus not eating. Got pt cracker, peanut butter, coffee however he did not take much in while I was with him. RN present and aware. Encouraged pt to walk halls later with staff.  Discussed with pt and s/o MI, stent, restrictions, Brilinta  importance,  diet, exercise, NTG, and CRPII. Pt receptive. Will refer to [facility].  [id number] Aliene Aris BS, ACSM-CEP 12/22/2024 8:48 AM

## 2024-12-23 ENCOUNTER — Other Ambulatory Visit (HOSPITAL_COMMUNITY): Payer: Self-pay

## 2024-12-23 LAB — BASIC METABOLIC PANEL WITH GFR
Anion gap: 8 (ref 5–15)
BUN: 21 mg/dL — ABNORMAL HIGH (ref 6–20)
CO2: 25 mmol/L (ref 22–32)
Calcium: 9.6 mg/dL (ref 8.9–10.3)
Chloride: 104 mmol/L (ref 98–111)
Creatinine, Ser: 1.84 mg/dL — ABNORMAL HIGH (ref 0.61–1.24)
GFR, Estimated: 42 mL/min — ABNORMAL LOW
Glucose, Bld: 100 mg/dL — ABNORMAL HIGH (ref 70–99)
Potassium: 4.5 mmol/L (ref 3.5–5.1)
Sodium: 138 mmol/L (ref 135–145)

## 2024-12-23 LAB — CBC
HCT: 46.2 % (ref 39.0–52.0)
Hemoglobin: 16.1 g/dL (ref 13.0–17.0)
MCH: 31.3 pg (ref 26.0–34.0)
MCHC: 34.8 g/dL (ref 30.0–36.0)
MCV: 89.7 fL (ref 80.0–100.0)
Platelets: 193 K/uL (ref 150–400)
RBC: 5.15 MIL/uL (ref 4.22–5.81)
RDW: 13.1 % (ref 11.5–15.5)
WBC: 7.8 K/uL (ref 4.0–10.5)
nRBC: 0 % (ref 0.0–0.2)

## 2024-12-23 LAB — LIPOPROTEIN A (LPA): Lipoprotein (a): <8.4 nmol/L

## 2024-12-23 NOTE — Progress Notes (Signed)
 Discharge   Patient and wife expressed verbal understanding of discharge POC.   Patient and wife given time to ask any questions.  Additional education included in AVS.  Alert oriented in good spirits.   Tele and PIV removed. CCMD/ [place]. Pressure dressings intact.  All personal belongings at bedtime. TOC meds ready. Transferring to Entrance by NT.

## 2024-12-23 NOTE — Discharge Summary (Signed)
 Physician Discharge Summary  Julian Myers FMW:[id number] DOB: 1966-12-03 DOA: 12/21/2024  PCP: [facility]  Admit date: 12/21/2024 Discharge date: 12/23/2024  Admitted From: Home Disposition:  Home  Discharge Condition:Stable CODE STATUS:FULL Diet recommendation: Heart Healthy   Brief/Interim Summary:  59 year old male with history of CKD stage IIIa/2, occasional cigar smoking, occasional EtOH use, overweight, migraine headaches, hypertension.  Patient presented with chest pain and shortness of breath.  Was found to have elevated troponin levels.  CT angiogram was negative for PE.  Concern was for coronary artery disease/NSTEMI.  Patient was hospitalized for further management.  Underwent cardiac cath with finding of multivessel disease.  Status post DES placement.  Started on aspirin  and Brilinta .  Cardiology cleared for discharge.  Medically stable for discharge home today.  Following problems were addressed during the hospitalization:  NSTEMI Patient with elevated troponin and chest pain.  EKG did not show any ischemic changes however CT angiogram which was negative for PE raised concern for coronary artery disease.  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  Underwent PCI and stenting of the left circumflex.  LAD disease to be managed medically. Patient is on aspirin  and Brilinta .  Patient is on statin.  LDL noted to be 98.  Triglycerides 231. Amlodipine  initiated by cardiology     Essential hypertension Continue amlodipine .  Losartan  being held due to trending up of creatinine   Chronic kidney disease stage IIIa Creatinine trended up today to the range of 1.8.  Check BMP in a week   History of migraine headaches Stable.   Obesity Estimated body mass index is 30.34 kg/m     Discharge Diagnoses:  Principal Problem:   Chest pain Active Problems:   History of migraine   Essential hypertension   Anxiety   NSTEMI (non-ST elevated  myocardial infarction) (HCC)   Unstable angina [facility])    Discharge Instructions  Discharge Instructions     Amb Referral to Cardiac Rehabilitation   Complete by: As directed    Diagnosis:  Coronary Stents PTCA NSTEMI     After initial evaluation and assessments completed: Virtual Based Care may be provided alone or in conjunction with Phase 2 Cardiac Rehab based on patient barriers.: Yes   Intensive Cardiac Rehabilitation (ICR) [facility] location only OR Traditional Cardiac Rehabilitation (TCR) *If criteria for ICR are not met will enroll in TCR [facility] only): Yes   Call MD for:  difficulty breathing, headache or visual disturbances   Complete by: As directed    Call MD for:  extreme fatigue   Complete by: As directed    Call MD for:  persistant dizziness or light-headedness   Complete by: As directed    Call MD for:  persistant nausea and vomiting   Complete by: As directed    Call MD for:  severe uncontrolled pain   Complete by: As directed    Call MD for:  temperature >100.4   Complete by: As directed    Diet - low sodium heart healthy   Complete by: As directed    Discharge instructions   Complete by: As directed    Please follow instructions provided by the cardiologist.  You were cared for by a hospitalist during your hospital stay. If you have any questions about your discharge medications or the care you received while you were in the hospital after you are discharged, you can call the unit and asked to speak with the hospitalist on call if the hospitalist that  took care of you is not available. Once you are discharged, your primary care physician will handle any further medical issues. Please note that NO REFILLS for any discharge medications will be authorized once you are discharged, as it is imperative that you return to your primary care physician (or establish a relationship with a primary care physician if you do not have one) for your aftercare needs so that they can reassess  your need for medications and monitor your lab values. If you do not have a primary care physician, you can call [phone] for a physician referral.   Discharge instructions   Complete by: As directed    1)Please take your medications as instructed 2)Follow up with your PCP in a week.  Do a BMP test during the follow-up to check your kidney function.  Monitor blood pressure at home 3)You will be called by cardiology for follow-up appointment   Increase activity slowly   Complete by: As directed    Increase activity slowly   Complete by: As directed       Allergies as of 12/23/2024   No Known Allergies      Medication List     STOP taking these medications    amoxicillin -clavulanate 875-125 MG tablet Commonly known as: AUGMENTIN    aspirin  325 MG tablet Replaced by: aspirin  EC 81 MG tablet   losartan  50 MG tablet Commonly known as: COZAAR    SUMAtriptan  50 MG tablet Commonly known as: IMITREX        TAKE these medications    amLODipine  5 MG tablet Commonly known as: NORVASC  Take 1 tablet (5 mg total) by mouth daily.   aspirin  EC 81 MG tablet Take 1 tablet (81 mg total) by mouth daily. Swallow whole. Replaces: aspirin  325 MG tablet   atorvastatin  80 MG tablet Commonly known as: LIPITOR Take 1 tablet (80 mg total) by mouth daily.   metoprolol  succinate 25 MG 24 hr tablet Commonly known as: TOPROL -XL Take 0.5 tablets (12.5 mg total) by mouth at bedtime.   ticagrelor  90 MG Tabs tablet Commonly known as: BRILINTA  Take 1 tablet (90 mg total) by mouth every 12 (twelve) hours.        Follow-up Information     [facility]. Schedule an appointment as soon as possible for a visit in 1 week(s).   Contact information: [address] [place] [address] [phone]                Allergies[1]  Consultations: Cardiology   Procedures/Studies: ECHOCARDIOGRAM COMPLETE Result Date: 12/22/2024    ECHOCARDIOGRAM REPORT   Patient Name:    Julian Myers Date of Exam: 12/22/2024 Medical Rec #:  [id number]  Height:       73.0 in Accession #:    [id number] Weight:       230.0 lb Date of Birth:  [birth year]  BSA:          2.283 m Patient Age:    58 years   BP:           151/82 mmHg Patient Gender: M          HR:           81 bpm. Exam Location:  Inpatient Procedure: 2D Echo, Cardiac Doppler, Color Doppler and Intracardiac            Opacification Agent (Both Spectral and Color Flow Doppler were            utilized during procedure). Indications:  NSTEMI 121.4  History:        Patient has no prior history of Echocardiogram examinations.                 Prior CABG; Risk Factors:Hypertension.  Sonographer:    Merlynn Argyle Referring Phys: [phone] PETER M JORDAN IMPRESSIONS  1. Left ventricular ejection fraction, by estimation, is 55 to 60%. The left ventricle has normal function. The left ventricle has no regional wall motion abnormalities. There is mild concentric left ventricular hypertrophy. Left ventricular diastolic parameters are consistent with Grade I diastolic dysfunction (impaired relaxation).  2. Right ventricular systolic function is normal. The right ventricular size is normal. There is normal pulmonary artery systolic pressure. The estimated right ventricular systolic pressure is 30.9 mmHg.  3. The mitral valve is normal in structure. No evidence of mitral valve regurgitation. No evidence of mitral stenosis.  4. The aortic valve is tricuspid. There is mild calcification of the aortic valve. There is mild thickening of the aortic valve. Aortic valve regurgitation is moderate. Aortic valve sclerosis/calcification is present, without any evidence of aortic stenosis.  5. Aortic dilatation noted. There is mild dilatation of the aortic root, measuring 40 mm.  6. The inferior vena cava is normal in size with greater than 50% respiratory variability, suggesting right atrial pressure of 3 mmHg. FINDINGS  Left Ventricle: Left ventricular ejection fraction, by  estimation, is 55 to 60%. The left ventricle has normal function. The left ventricle has no regional wall motion abnormalities. Definity  contrast agent was given IV to delineate the left ventricular  endocardial borders. The left ventricular internal cavity size was normal in size. There is mild concentric left ventricular hypertrophy. Left ventricular diastolic parameters are consistent with Grade I diastolic dysfunction (impaired relaxation). Normal left ventricular filling pressure. Right Ventricle: The right ventricular size is normal. Right vetricular wall thickness was not well visualized. Right ventricular systolic function is normal. There is normal pulmonary artery systolic pressure. The tricuspid regurgitant velocity is 2.64 m/s, and with an assumed right atrial pressure of 3 mmHg, the estimated right ventricular systolic pressure is 30.9 mmHg. Left Atrium: Left atrial size was normal in size. Right Atrium: Right atrial size was normal in size. Pericardium: There is no evidence of pericardial effusion. Mitral Valve: The mitral valve is normal in structure. No evidence of mitral valve regurgitation. No evidence of mitral valve stenosis. Tricuspid Valve: The tricuspid valve is normal in structure. Tricuspid valve regurgitation is mild. Aortic Valve: The aortic valve is tricuspid. There is mild calcification of the aortic valve. There is mild thickening of the aortic valve. Aortic valve regurgitation is moderate. Aortic regurgitation PHT measures 85 msec. Aortic valve sclerosis/calcification is present, without any evidence of aortic stenosis. Pulmonic Valve: The pulmonic valve was not well visualized. Pulmonic valve regurgitation is trivial. No evidence of pulmonic stenosis. Aorta: Aortic dilatation noted. There is mild dilatation of the aortic root, measuring 40 mm. Venous: The inferior vena cava is normal in size with greater than 50% respiratory variability, suggesting right atrial pressure of 3 mmHg.  IAS/Shunts: No atrial level shunt detected by color flow Doppler.  LEFT VENTRICLE PLAX 2D LVIDd:         4.80 cm   Diastology LVIDs:         3.40 cm   LV e' medial:    6.42 cm/s LV PW:         1.30 cm   LV E/e' medial:  8.2 LV IVS:  1.30 cm   LV e' lateral:   6.42 cm/s LVOT diam:     2.16 cm   LV E/e' lateral: 8.2 LV SV:         77 LV SV Index:   34 LVOT Area:     3.66 cm  RIGHT VENTRICLE             IVC RV Basal diam:  4.44 cm     IVC diam: 0.91 cm RV Mid diam:    3.68 cm RV S prime:     13.70 cm/s TAPSE (M-mode): 2.5 cm LEFT ATRIUM             Index        RIGHT ATRIUM           Index LA diam:        4.07 cm 1.78 cm/m   RA Area:     17.50 cm LA Vol (A2C):   37.8 ml 16.55 ml/m  RA Volume:   50.40 ml  22.07 ml/m LA Vol (A4C):   28.5 ml 12.48 ml/m LA Biplane Vol: 32.7 ml 14.32 ml/m  AORTIC VALVE LVOT Vmax:   113.00 cm/s LVOT Vmean:  76.600 cm/s LVOT VTI:    0.209 m AI PHT:      85 msec  AORTA Ao Root diam: 3.98 cm Ao Asc diam:  3.66 cm MITRAL VALVE               TRICUSPID VALVE MV Area (PHT): 2.55 cm    TR Peak grad:   27.9 mmHg MV Decel Time: 298 msec    TR Vmax:        264.00 cm/s MV E velocity: 52.40 cm/s MV A velocity: 79.70 cm/s  SHUNTS MV E/A ratio:  0.66        Systemic VTI:  0.21 m                            Systemic Diam: 2.16 cm Jerel Croitoru MD Electronically signed by Jerel Balding MD Signature Date/Time: 12/22/2024/1:47:45 PM    Final    CARDIAC CATHETERIZATION Result Date: 12/22/2024   Prox LAD lesion is 50% stenosed.   Dist LAD lesion is 80% stenosed.   Mid Cx lesion is 95% stenosed.   A drug-eluting stent was successfully placed using a STENT SYNERGY XD [id number].   Post intervention, there is a 0% residual stenosis.   LV end diastolic pressure is normal.   In the absence of any other complications or medical issues, we expect the patient to be ready for discharge from an interventional cardiology perspective on 12/22/2024.   Recommend uninterrupted dual antiplatelet therapy with Aspirin   81mg  daily and Ticagrelor  90mg  twice daily for a minimum of 12 months (ACS-Class I recommendation). 2 vessel obstructive CAD. Culprit lesion in the LCx which is a large vessel. Normal LVEDP Successful PCI of the LCx with DES Plan: DAPT for one year. Will treat residual disease in the LAD medically. Assess LV function by Echo. Anticipate DC in am. Risk factor modification.   CT Angio Chest PE W and/or Wo Contrast Result Date: 12/21/2024 EXAM: CTA CHEST 12/21/2024 02:20:00 AM TECHNIQUE: CTA of the chest was performed without and with the administration of 85 mL of iohexol  (OMNIPAQUE ) 350 MG/ML injection. Multiplanar reformatted images are provided for review. MIP images are provided for review. Automated exposure control, iterative reconstruction, and/or weight based adjustment of the mA/kV was utilized to  reduce the radiation dose to as low as reasonably achievable. COMPARISON: CTA chest from 11/25/2024 and PA lateral chest 11/27/2024. CLINICAL HISTORY: Chest pain and shortness of breath ongoing for roughly 1 month, with radiation down the left upper extremity. Pulmonary embolism (PE) suspected, high prob. FINDINGS: PULMONARY ARTERIES: Pulmonary arteries are adequately opacified for evaluation. No acute pulmonary embolus. Main pulmonary artery is normal in caliber. MEDIASTINUM: The cardiac size is normal. There are patchy calcifications in the LAD coronary artery, minimal scattered calcification in the circumflex artery. Minimal pericardial effusion again noted anteriorly. There is trace calcified plaque in the distal aortic arch. The aorta and great vessels are otherwise normal. The pulmonary veins are nondistended. LYMPH NODES: No mediastinal, hilar or axillary lymphadenopathy. LUNGS AND PLEURA: The lungs are without acute process. No focal consolidation or pulmonary edema. No evidence of pleural effusion or pneumothorax. UPPER ABDOMEN: There is a small hiatal hernia. In the abdomen, the liver demonstrates mild  to moderate steatosis. No acute upper abdominal abnormality. SOFT TISSUES AND BONES: Thoracic spondylosis and multilevel bridging enthesopathy. Small amount of subareolar dendritic gynecomastia of both breasts. IMPRESSION: 1. No evidence of pulmonary embolism. No acute chest CT or CTA findings. 2. Minimal pericardial effusion anteriorly. 3. 2 vessel Coronary artery calcifications, greatest in the LAD. 4. Small hiatal hernia. Electronically signed by: Francis Quam MD 12/21/2024 02:49 AM EST RP Workstation: [id number]   DG Chest 2 View Result Date: 11/27/2024 EXAM: 2 VIEW(S) XRAY OF THE CHEST 11/27/2024 09:27:00 PM COMPARISON: Chest x-ray 11/25/2024, CT chest 11/25/2024. CLINICAL HISTORY: Chest pain. FINDINGS: LUNGS AND PLEURA: No focal pulmonary opacity. No pleural effusion. No pneumothorax. HEART AND MEDIASTINUM: No acute abnormality of the cardiac and mediastinal silhouettes. BONES AND SOFT TISSUES: No acute osseous abnormality. IMPRESSION: 1. No acute cardiopulmonary abnormality. Electronically signed by: Morgane Naveau MD 11/27/2024 10:44 PM EST RP Workstation: [id number]   CT Angio Chest PE W and/or Wo Contrast Result Date: 11/25/2024 EXAM: CTA CHEST 11/25/2024 10:11:36 AM TECHNIQUE: CTA of the chest was performed after the administration of intravenous contrast. Multiplanar reformatted images are provided for review. MIP images are provided for review. Automated exposure control, iterative reconstruction, and/or weight based adjustment of the mA/kV was utilized to reduce the radiation dose to as low as reasonably achievable. COMPARISON: 11/25/2024 radiograph CLINICAL HISTORY: Pulmonary embolism (PE) suspected, high prob. FINDINGS: PULMONARY ARTERIES: Pulmonary arteries are adequately opacified for evaluation. No acute pulmonary embolus. Main pulmonary artery is normal in caliber. MEDIASTINUM: Diffuse multivessel coronary atherosclerosis. Trace pericardial effusion. There is no acute abnormality of the  thoracic aorta. LYMPH NODES: No mediastinal, hilar or axillary lymphadenopathy. LUNGS AND PLEURA: The lungs are without acute process. No focal consolidation or pulmonary edema. No evidence of pleural effusion or pneumothorax. UPPER ABDOMEN: Small hiatal hernia. SOFT TISSUES AND BONES: Small volume symmetric bilateral gynecomastia. Thoracic DISH. No acute bone or soft tissue abnormality. IMPRESSION: 1. No pulmonary embolism. No pneumonia, pulmonary edema, or pleural effusion. Electronically signed by: Rogelia Myers MD 11/25/2024 10:26 AM EST RP Workstation: [id number]   DG Chest Portable 1 View Result Date: 11/25/2024 CLINICAL DATA:  Chest pain EXAM: PORTABLE CHEST 1 VIEW COMPARISON:  Apr 14, 2019 FINDINGS: The cardiomediastinal silhouette is unchanged in contour. No pleural effusion. No pneumothorax. No acute pleuroparenchymal abnormality. IMPRESSION: No acute cardiopulmonary abnormality. Electronically Signed   By: Corean Salter M.D.   On: 11/25/2024 08:28      Subjective: Patient seen and examined at bedside today.  Remains comfortable.  Eager to go home.  Denying new complaints.  Medically stable for discharge  Discharge Exam: Vitals:   12/23/24 0455 12/23/24 0839  BP: 135/83 (!) 145/81  Pulse: 97 90  Resp: 19 20  Temp: 98.1 F (36.7 C) 97.8 F (36.6 C)  SpO2:  97%   Vitals:   12/22/24 2036 12/23/24 0017 12/23/24 0455 12/23/24 0839  BP: (!) 158/97 (!) 147/85 135/83 (!) 145/81  Pulse: 97 86 97 90  Resp: 16 18 19 20   Temp: 98 F (36.7 C) 98 F (36.7 C) 98.1 F (36.7 C) 97.8 F (36.6 C)  TempSrc: Oral Oral Oral Oral  SpO2:    97%  Weight:      Height:        General: Pt is alert, awake, not in acute distress Cardiovascular: RRR, S1/S2 +, no rubs, no gallops Respiratory: CTA bilaterally, no wheezing, no rhonchi Abdominal: Soft, NT, ND, bowel sounds + Extremities: no edema, no cyanosis    The results of significant diagnostics from this hospitalization (including  imaging, microbiology, ancillary and laboratory) are listed below for reference.     Microbiology: Recent Results (from the past 240 hours)  Resp panel by RT-PCR (RSV, Flu A&B, Covid) Anterior Nasal Swab     Status: None   Collection Time: 12/21/24  1:00 AM   Specimen: Anterior Nasal Swab  Result Value Ref Range Status   SARS Coronavirus 2 by RT PCR NEGATIVE NEGATIVE Final    Comment: (NOTE) SARS-CoV-2 target nucleic acids are NOT DETECTED.  The SARS-CoV-2 RNA is generally detectable in upper respiratory specimens during the acute phase of infection. The lowest concentration of SARS-CoV-2 viral copies this assay can detect is 138 copies/mL. A negative result does not preclude SARS-Cov-2 infection and should not be used as the sole basis for treatment or other patient management decisions. A negative result may occur with  improper specimen collection/handling, submission of specimen other than nasopharyngeal swab, presence of viral mutation(s) within the areas targeted by this assay, and inadequate number of viral copies(<138 copies/mL). A negative result must be combined with clinical observations, patient history, and epidemiological information. The expected result is Negative.  Fact Sheet for Patients:  bloggercourse.com  Fact Sheet for Healthcare Providers:  seriousbroker.it  This test is no t yet approved or cleared by the [place]  FDA and  has been authorized for detection and/or diagnosis of SARS-CoV-2 by FDA under an Emergency Use Authorization (EUA). This EUA will remain  in effect (meaning this test can be used) for the duration of the COVID-19 declaration under Section 564(b)(1) of the Act, 21 U.S.C.section 360bbb-3(b)(1), unless the authorization is terminated  or revoked sooner.       Influenza A by PCR NEGATIVE NEGATIVE Final   Influenza B by PCR NEGATIVE NEGATIVE Final    Comment: (NOTE) The Xpert  Xpress SARS-CoV-2/FLU/RSV plus assay is intended as an aid in the diagnosis of influenza from Nasopharyngeal swab specimens and should not be used as a sole basis for treatment. Nasal washings and aspirates are unacceptable for Xpert Xpress SARS-CoV-2/FLU/RSV testing.  Fact Sheet for Patients: bloggercourse.com  Fact Sheet for Healthcare Providers: seriousbroker.it  This test is not yet approved or cleared by the [place]  FDA and has been authorized for detection and/or diagnosis of SARS-CoV-2 by FDA under an Emergency Use Authorization (EUA). This EUA will remain in effect (meaning this test can be used) for the duration of the COVID-19 declaration under Section 564(b)(1) of the Act, 21 U.S.C. section 360bbb-3(b)(1), unless the authorization is  terminated or revoked.     Resp Syncytial Virus by PCR NEGATIVE NEGATIVE Final    Comment: (NOTE) Fact Sheet for Patients: bloggercourse.com  Fact Sheet for Healthcare Providers: seriousbroker.it  This test is not yet approved or cleared by the [place]  FDA and has been authorized for detection and/or diagnosis of SARS-CoV-2 by FDA under an Emergency Use Authorization (EUA). This EUA will remain in effect (meaning this test can be used) for the duration of the COVID-19 declaration under Section 564(b)(1) of the Act, 21 U.S.C. section 360bbb-3(b)(1), unless the authorization is terminated or revoked.  Performed at [facility], [address] [place], [place] [address]      Labs: BNP (last 3 results) No results for input(s): BNP in the last 8760 hours. Basic Metabolic Panel: Recent Labs  Lab 12/21/24 0100 12/21/24 1913 12/22/24 0419 12/23/24 0452  NA 140  --  140 138  K 4.3  --  4.2 4.5  CL 107  --  104 104  CO2 24  --  22 25  GLUCOSE 116*  --  88 100*  BUN 21*  --  16 21*  CREATININE 1.65* 1.41*  1.60* 1.84*  CALCIUM  9.5  --  9.6 9.6   Liver Function Tests: Recent Labs  Lab 12/21/24 0100  AST 21  ALT 29  ALKPHOS 68  BILITOT 0.4  PROT 6.4*  ALBUMIN 4.1   No results for input(s): LIPASE, AMYLASE in the last 168 hours. No results for input(s): AMMONIA in the last 168 hours. CBC: Recent Labs  Lab 12/21/24 0100 12/21/24 1913 12/22/24 0419 12/23/24 0452  WBC 6.9 8.0 7.8 7.8  NEUTROABS 3.5  --   --   --   HGB 14.7 16.3 16.2 16.1  HCT 41.8 46.1 46.1 46.2  MCV 88.7 89.0 89.3 89.7  PLT 169 190 197 193   Cardiac Enzymes: No results for input(s): CKTOTAL, CKMB, CKMBINDEX, TROPONINI in the last 168 hours. BNP: Invalid input(s): POCBNP CBG: No results for input(s): GLUCAP in the last 168 hours. D-Dimer No results for input(s): DDIMER in the last 72 hours. Hgb A1c Recent Labs    12/21/24 1913  HGBA1C 5.7*   Lipid Profile Recent Labs    12/22/24 0419  CHOL 175  HDL 31*  LDLCALC 98  TRIG 768*  CHOLHDL 5.7   Thyroid function studies No results for input(s): TSH, T4TOTAL, T3FREE, THYROIDAB in the last 72 hours.  Invalid input(s): FREET3 Anemia work up No results for input(s): VITAMINB12, FOLATE, FERRITIN, TIBC, IRON, RETICCTPCT in the last 72 hours. Urinalysis No results found for: COLORURINE, APPEARANCEUR, LABSPEC, PHURINE, GLUCOSEU, HGBUR, BILIRUBINUR, KETONESUR, PROTEINUR, UROBILINOGEN, NITRITE, LEUKOCYTESUR Sepsis Labs Recent Labs  Lab 12/21/24 0100 12/21/24 1913 12/22/24 0419 12/23/24 0452  WBC 6.9 8.0 7.8 7.8   Microbiology Recent Results (from the past 240 hours)  Resp panel by RT-PCR (RSV, Flu A&B, Covid) Anterior Nasal Swab     Status: None   Collection Time: 12/21/24  1:00 AM   Specimen: Anterior Nasal Swab  Result Value Ref Range Status   SARS Coronavirus 2 by RT PCR NEGATIVE NEGATIVE Final    Comment: (NOTE) SARS-CoV-2 target nucleic acids are NOT DETECTED.  The  SARS-CoV-2 RNA is generally detectable in upper respiratory specimens during the acute phase of infection. The lowest concentration of SARS-CoV-2 viral copies this assay can detect is 138 copies/mL. A negative result does not preclude SARS-Cov-2 infection and should not be used as the sole basis for treatment  or other patient management decisions. A negative result may occur with  improper specimen collection/handling, submission of specimen other than nasopharyngeal swab, presence of viral mutation(s) within the areas targeted by this assay, and inadequate number of viral copies(<138 copies/mL). A negative result must be combined with clinical observations, patient history, and epidemiological information. The expected result is Negative.  Fact Sheet for Patients:  bloggercourse.com  Fact Sheet for Healthcare Providers:  seriousbroker.it  This test is no t yet approved or cleared by the [place]  FDA and  has been authorized for detection and/or diagnosis of SARS-CoV-2 by FDA under an Emergency Use Authorization (EUA). This EUA will remain  in effect (meaning this test can be used) for the duration of the COVID-19 declaration under Section 564(b)(1) of the Act, 21 U.S.C.section 360bbb-3(b)(1), unless the authorization is terminated  or revoked sooner.       Influenza A by PCR NEGATIVE NEGATIVE Final   Influenza B by PCR NEGATIVE NEGATIVE Final    Comment: (NOTE) The Xpert Xpress SARS-CoV-2/FLU/RSV plus assay is intended as an aid in the diagnosis of influenza from Nasopharyngeal swab specimens and should not be used as a sole basis for treatment. Nasal washings and aspirates are unacceptable for Xpert Xpress SARS-CoV-2/FLU/RSV testing.  Fact Sheet for Patients: bloggercourse.com  Fact Sheet for Healthcare Providers: seriousbroker.it  This test is not yet approved or  cleared by the [place]  FDA and has been authorized for detection and/or diagnosis of SARS-CoV-2 by FDA under an Emergency Use Authorization (EUA). This EUA will remain in effect (meaning this test can be used) for the duration of the COVID-19 declaration under Section 564(b)(1) of the Act, 21 U.S.C. section 360bbb-3(b)(1), unless the authorization is terminated or revoked.     Resp Syncytial Virus by PCR NEGATIVE NEGATIVE Final    Comment: (NOTE) Fact Sheet for Patients: bloggercourse.com  Fact Sheet for Healthcare Providers: seriousbroker.it  This test is not yet approved or cleared by the [place]  FDA and has been authorized for detection and/or diagnosis of SARS-CoV-2 by FDA under an Emergency Use Authorization (EUA). This EUA will remain in effect (meaning this test can be used) for the duration of the COVID-19 declaration under Section 564(b)(1) of the Act, 21 U.S.C. section 360bbb-3(b)(1), unless the authorization is terminated or revoked.  Performed at [facility], [address], [place], [place] [address]     Please note: You were cared for by a hospitalist during your hospital stay. Once you are discharged, your primary care physician will handle any further medical issues. Please note that NO REFILLS for any discharge medications will be authorized once you are discharged, as it is imperative that you return to your primary care physician (or establish a relationship with a primary care physician if you do not have one) for your post hospital discharge needs so that they can reassess your need for medications and monitor your lab values.    Time coordinating discharge: 40 minutes  SIGNED:   Ivonne Mustache, MD  [facility] 12/23/2024, 10:01 AM Pager [phone]  If 7PM-7AM, please contact night-coverage [email or web address] Password [email or web address]    [1] No Known Allergies

## 2024-12-23 NOTE — Progress Notes (Addendum)
 "  Progress Note  Patient Name: Julian Myers Date of Encounter: 12/23/2024 [facility] Cardiologist: Macedonio Scallon K Fionna Merriott, MD   Interval Summary    Feeling well this morning. No issues overnight.   Vital Signs Vitals:   12/22/24 1751 12/22/24 2036 12/23/24 0017 12/23/24 0455  BP: (!) 149/88 (!) 158/97 (!) 147/85 135/83  Pulse:  97 86 97  Resp: 18 16 18 19   Temp: 98.1 F (36.7 C) 98 F (36.7 C) 98 F (36.7 C) 98.1 F (36.7 C)  TempSrc: Oral Oral Oral Oral  SpO2:      Weight:      Height:        Intake/Output Summary (Last 24 hours) at 12/23/2024 0747 Last data filed at 12/22/2024 0915 Gross per 24 hour  Intake 240 ml  Output 0 ml  Net 240 ml      12/21/2024   12:13 AM 11/25/2024    7:54 AM 02/12/2023   10:04 AM  Last 3 Weights  Weight (lbs) 230 lb 230 lb 222 lb  Weight (kg) 104.327 kg 104.327 kg 100.699 kg      Telemetry/ECG   Sinus Rhythm - Personally Reviewed  Physical Exam  GEN: No acute distress.   Neck: No JVD Cardiac: RRR, no murmurs, rubs, or gallops.  Respiratory: Clear to auscultation bilaterally. GI: Soft, nontender, non-distended  MS: No edema VAS: right radial cath site stable   Assessment & Plan    59 y.o. male with a history of hypertension, anxiety, and migraines who was seen 12/21/2024 for the evaluation of chest pain at the request of Dr. Sherre.   NSTEMI -- presented with progressive angina for the past 1.5 months. EKG on admission without ischemic changes. hsTn 113>>109.  -- cardiac cath 1/19 showed 95% stenosis of mid LCX, 50% stenosis of proximal LAD, and 80% stenosis of distal LAD. S/p successful PCI with DES to LCX. Residual LAD disease was treated medically. Recommendations for DAPT with ASA/Brilinta  for one year  -- Echo 1/20 LVEF of 55-60%, normal RV, g1DD, moderate aortic regurgitation  -- continue ASA, Brilinta , statin  HTN -- continue amlodipine  5mg  daily, Toprol  12.5mg  daily/ Hold losartan  25mg  daily with rise in Cr    HLD -- LDL 98, HDL 31 -- continue atorvastatin  80mg  daily   CKD stage III -- Cr baseline around 1.4-1.7 -- up 1.84 this morning, as above hold losartan  -- follow up BMET at outpatient appt   PreDM -- Hemoglobin A1c 5.7%  -- consider SGLT2 in the future with CKD   Will arrange outpatient follow up  For questions or updates, please contact [facility] Please consult [email or web address] for contact info under   Signed, Manuelita Rummer, NP   ATTENDING ATTESTATION:  After conducting a review of all available clinical information with the care team, interviewing the patient, and performing a physical exam, I agree with the findings and plan described in this note with adjustments as indicated below which were discussed and enacted by staff above.   GEN: No acute distress, AO x 3 HEENT:  MMM, no JVD, no scleral icterus Cardiac: RRR, no murmurs, rubs, or gallops.  Respiratory: Clear to auscultation bilaterally. GI: Soft, nontender, non-distended  MS: No edema; No deformity. Neuro:  Nonfocal  Vasc:  R radial clean dry intact  Patient doing well this morning.  Agree with above recommendations with DAPT x 1 year, atorvastatin  80mg , Toprol  12.5mg  daily.  Will restart losartan  as outpatient and consider SGLT2i.  Ok for d/c home  with close hospital follow up.  Ader Fritze, MD Pager [phone]   "

## 2024-12-23 NOTE — TOC CM/SW Note (Addendum)
 Transition of Care [facility]) - Inpatient Brief Assessment   Patient Details  Name: Julian Myers MRN: [id number] Date of Birth: 1966/02/25  Transition of Care [facility]) CM/SW Contact:    Sudie Erminio Deems, RN Phone Number: 12/23/2024, 10:50 AM   Clinical Narrative: Patient presented for Nstemi-plan for home on Brilinta . Patient has PCP with [facility]; however, wants to change providers. ICM has submitted patient to the CMA for new PCP needs. Information to be placed on the AVS. 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 FC has spoken with patient to see if eligible for Medicaid. ICM will MATCH the patient for medications.    MATCH MEDICATION ASSISTANCE CARD Pharmacies please call [phone] for claim processing assistance.  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

## 2024-12-23 NOTE — Plan of Care (Signed)

## 2024-12-31 ENCOUNTER — Ambulatory Visit: Payer: Self-pay | Attending: Cardiology | Admitting: Cardiology

## 2024-12-31 ENCOUNTER — Encounter: Payer: Self-pay | Admitting: Cardiology

## 2024-12-31 VITALS — BP 134/74 | HR 99 | Ht 73.0 in | Wt 225.0 lb

## 2024-12-31 DIAGNOSIS — I10 Essential (primary) hypertension: Secondary | ICD-10-CM

## 2024-12-31 DIAGNOSIS — E782 Mixed hyperlipidemia: Secondary | ICD-10-CM

## 2024-12-31 DIAGNOSIS — E66811 Obesity, class 1: Secondary | ICD-10-CM

## 2024-12-31 DIAGNOSIS — I214 Non-ST elevation (NSTEMI) myocardial infarction: Secondary | ICD-10-CM

## 2024-12-31 MED ORDER — METOPROLOL SUCCINATE ER 25 MG PO TB24: 12.5000 mg | ORAL_TABLET | Freq: Every day | ORAL | 3 refills | Status: AC

## 2024-12-31 NOTE — Progress Notes (Signed)
 " Cardiology Office Note:    Date:  12/31/2024   ID:  Julian Myers, DOB [birth year], MRN [id number]  PCP:  [facility]  Cardiologist:  Jennifer JONELLE Crape, MD   Referring MD: Ruthe Cornet, DO    ASSESSMENT:    1. Essential hypertension   2. NSTEMI (non-ST elevated myocardial infarction) (HCC)   3. Mixed dyslipidemia   4. Obesity (BMI 30.0-34.9)    PLAN:    In order of problems listed above:  Coronary artery disease:Primary prevention stressed with the patient.  Importance of compliance with diet medication stressed and patient verbalized standing. He was advised to walk on a regular basis in a graded fashion.  I discussed cardiac rehab but is not keen on it. Coronary angiography report was discussed with the patient at extensive length and questions were answered to satisfaction. Essential hypertension: Blood pressure stable and diet was emphasized.  Lifestyle modification encouraged.  Salt intake issues were discussed. Mixed dyslipidemia: On lipid-lowering medications.  I will be due back in 6 weeks for lipid lipid check.  Goal LDL less than 60. Obesity: Weight reduction stressed diet emphasized and he promises to do better.  Risks of obesity explained. Patient will be seen in follow-up appointment in 6 months or earlier if the patient has any concerns.    Medication Adjustments/Labs and Tests Ordered: Current medicines are reviewed at length with the patient today.  Concerns regarding medicines are outlined above.  Orders Placed This Encounter  Procedures   EKG 12-Lead   No orders of the defined types were placed in this encounter.    No chief complaint on file.    History of Present Illness:    Julian Myers is a 59 y.o. male.  Patient has past medical history of recently diagnosed non-STEMI, essential hypertension and mixed dyslipidemia.  He denies any problems at this time.  He underwent coronary angiography and details were discussed with him.  He is  here for post non-STEMI follow-up.  At the time of my evaluation, the patient is alert awake oriented and in no distress.  Past Medical History:  Diagnosis Date   Anxiety    Anxiety state 07/15/2019   Environmental allergies 07/15/2019   Essential hypertension 07/15/2019   History of migraine 07/15/2019   Hypertension     Past Surgical History:  Procedure Laterality Date   ANKLE SURGERY     BACK SURGERY     CORONARY STENT INTERVENTION N/A 12/21/2024   Procedure: CORONARY STENT INTERVENTION;  Surgeon: Jordan, Peter M, MD;  [facility] INVASIVE CV LAB;  Service: Cardiovascular;  Laterality: N/A;   LEFT HEART CATH AND CORONARY ANGIOGRAPHY N/A 12/21/2024   Procedure: LEFT HEART CATH AND CORONARY ANGIOGRAPHY;  Surgeon: Jordan, Peter M, MD;  Location: [facility] INVASIVE CV LAB;  Service: Cardiovascular;  Laterality: N/A;   NECK SURGERY      Current Medications: Active Medications[1]   Allergies:   Patient has no known allergies.   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: [employer]  Tobacco Use   Smoking status: Some Days    Types: Cigars   Smokeless tobacco: Never  Vaping Use   Vaping status: Never Used  Substance and Sexual Activity  Alcohol use: Yes    Comment: occ   Drug use: No   Sexual activity: Not on file  Other Topics Concern   Not on file  Social History Narrative   Not on file   Social Drivers of Health   Tobacco Use: High Risk (12/31/2024)   Patient History    Smoking Tobacco Use: Some Days    Smokeless Tobacco Use: Never    Passive Exposure: Not on file  Financial Resource Strain: Low Risk (12/26/2024)   Received from [facility]   Overall Financial Resource Strain (CARDIA)    How hard is it for you to pay for the very basics like food,  housing, medical care, and heating?: Not hard at all  Food Insecurity: Low Risk (12/26/2024)   Received from [facility]   Epic    Within the past 12 months, you worried that your food would run out before you got money to buy more: Never true    Within the past 12 months, the food you bought just didn't last and you didn't have money to get more. : Never true  Transportation Needs: No Transportation Needs (12/26/2024)   Received from [facility]    In the past 12 months, has lack of reliable transportation kept you from medical appointments, meetings, work or from getting things needed for daily living? : No  Physical Activity: Not on file  Stress: Not on file  Social Connections: Moderately Isolated (12/26/2024)   Received from [facility]   Social Connection and Isolation Panel    In a typical week, how many times do you talk on the phone with family, friends, or neighbors?: More than three times a week    How often do you get together with friends or relatives?: More than three times a week    How often do you attend church or religious services?: Never    Do you belong to any clubs or organizations such as church groups, unions, fraternal or athletic groups, or school groups?: No    How often do you attend meetings of the clubs or organizations you belong to?: Never    Are you married, widowed, divorced, separated, never married, or living with a partner?: Living with partner  Depression (PHQ2-9): Not on file  Alcohol Screen: Not on file  Housing: Low Risk (12/26/2024)   Received from [facility]   Epic    What is your living situation today?: I have a steady place to live    Think about the place you live. Do you have problems with any of the following? Choose all that apply:: None/None on this list  Utilities: Low Risk (12/26/2024)   Received from [facility]   Utilities    In the past 12 months has the electric, gas, oil, or water  company threatened to  shut off services in your home? : No  Health Literacy: Not on file     Family History: The patient's family history includes Diabetes in his mother; Hypertension in his father and mother.  ROS:   Please see the history of present illness.    All other systems reviewed and are negative.  EKGs/Labs/Other Studies Reviewed:    The following studies were reviewed today: I discussed my findings with the patient at length   Recent Labs: 12/21/2024: ALT 29; Pro Brain Natriuretic Peptide 417.0 12/23/2024: BUN 21; Creatinine, Ser 1.84; Hemoglobin 16.1; Platelets 193; Potassium 4.5; Sodium 138  Recent Lipid Panel    Component  Value Date/Time   CHOL 175 12/22/2024 0419   TRIG 231 (H) 12/22/2024 0419   HDL 31 (L) 12/22/2024 0419   CHOLHDL 5.7 12/22/2024 0419   VLDL 46 (H) 12/22/2024 0419   LDLCALC 98 12/22/2024 0419    Physical Exam:    VS:  BP 134/74   Pulse 99   Ht 6' 1 (1.854 m)   Wt 225 lb (102.1 kg)   SpO2 96%   BMI 29.69 kg/m     Wt Readings from Last 3 Encounters:  12/31/24 225 lb (102.1 kg)  12/21/24 230 lb (104.3 kg)  11/25/24 230 lb (104.3 kg)     GEN: Patient is in no acute distress HEENT: Normal NECK: No JVD; No carotid bruits LYMPHATICS: No lymphadenopathy CARDIAC: Hear sounds regular, 2/6 systolic murmur at the apex. RESPIRATORY:  Clear to auscultation without rales, wheezing or rhonchi  ABDOMEN: Soft, non-tender, non-distended MUSCULOSKELETAL:  No edema; No deformity  SKIN: Warm and dry NEUROLOGIC:  Alert and oriented x 3 PSYCHIATRIC:  Normal affect   Signed, Jennifer JONELLE Crape, MD  12/31/2024 2:36 PM    [facility]      [1]  Current Meds  Medication Sig   amLODipine  (NORVASC ) 5 MG tablet Take 1 tablet (5 mg total) by mouth daily.   aspirin  EC 81 MG tablet Take 1 tablet (81 mg total) by mouth daily. Swallow whole.   atorvastatin  (LIPITOR) 80 MG tablet Take 1 tablet (80 mg total) by mouth daily.   metoprolol  succinate  (TOPROL -XL) 25 MG 24 hr tablet Take 0.5 tablets (12.5 mg total) by mouth at bedtime.   nitroGLYCERIN  (NITROSTAT ) 0.4 MG SL tablet Place 0.4 mg under the tongue every 5 (five) minutes as needed for chest pain.   ticagrelor  (BRILINTA ) 90 MG TABS tablet Take 1 tablet (90 mg total) by mouth every 12 (twelve) hours.   "

## 2024-12-31 NOTE — Patient Instructions (Addendum)
" ° ° °   ° ° °  Medication Instructions:  Your physician recommends that you continue on your current medications as directed. Please refer to the Current Medication list given to you today.  *If you need a refill on your cardiac medications before your next appointment, please call your pharmacy*   Lab Work: Your physician recommends that you return for lab work in: 6 weeks for CMP and lipids. You need to have labs done when you are fasting. [facility] lab is located on the 3rd floor, [address]. Hours are Monday - Friday 8 am to 4 pm, closed 11:30 am to 1:00 pm. You do NOT need an appointment.   If you have labs (blood work) drawn today and your tests are completely normal, you will receive your results only by: MyChart Message (if you have MyChart) OR A paper copy in the mail If you have any lab test that is abnormal or we need to change your treatment, we will call you to review the results.   Testing/Procedures: None ordered   Follow-Up: At [facility], you and your health needs are our priority.  As part of our continuing mission to provide you with exceptional heart care, we have created designated Provider Care Teams.  These Care Teams include your primary Cardiologist (physician) and Advanced Practice Providers (APPs -  Physician Assistants and Nurse Practitioners) who all work together to provide you with the care you need, when you need it.  We recommend signing up for the patient portal called MyChart.  Sign up information is provided on this After Visit Summary.  MyChart is used to connect with patients for Virtual Visits (Telemedicine).  Patients are able to view lab/test results, encounter notes, upcoming appointments, etc.  Non-urgent messages can be sent to your provider as well.   To learn more about what you can do with MyChart, go to [email or web address].    Your next appointment:   6 month(s)  The format for your next appointment:   In Person  Provider:    Jennifer Crape, MD    Other Instructions none  Important Information About Sugar      "

## 2025-01-20 ENCOUNTER — Ambulatory Visit: Payer: Self-pay | Admitting: Physician Assistant
